# Patient Record
Sex: Female | Born: 1960 | Race: White | Hispanic: No | Marital: Single | Smoking: Former smoker
Health system: Southern US, Community
[De-identification: ages and names within clinical notes are randomized; demographics above are authoritative.]

## PROBLEM LIST (undated history)

## (undated) DIAGNOSIS — R569 Unspecified convulsions: Secondary | ICD-10-CM

## (undated) DIAGNOSIS — Z87891 Personal history of nicotine dependence: Secondary | ICD-10-CM

## (undated) DIAGNOSIS — I69319 Unspecified symptoms and signs involving cognitive functions following cerebral infarction: Secondary | ICD-10-CM

## (undated) DIAGNOSIS — I639 Cerebral infarction, unspecified: Secondary | ICD-10-CM

## (undated) DIAGNOSIS — N920 Excessive and frequent menstruation with regular cycle: Secondary | ICD-10-CM

## (undated) DIAGNOSIS — E785 Hyperlipidemia, unspecified: Secondary | ICD-10-CM

## (undated) DIAGNOSIS — F419 Anxiety disorder, unspecified: Secondary | ICD-10-CM

## (undated) DIAGNOSIS — N201 Calculus of ureter: Secondary | ICD-10-CM

## (undated) DIAGNOSIS — N1330 Unspecified hydronephrosis: Secondary | ICD-10-CM

## (undated) DIAGNOSIS — K219 Gastro-esophageal reflux disease without esophagitis: Secondary | ICD-10-CM

## (undated) DIAGNOSIS — D126 Benign neoplasm of colon, unspecified: Secondary | ICD-10-CM

## (undated) DIAGNOSIS — F32A Depression, unspecified: Secondary | ICD-10-CM

## (undated) DIAGNOSIS — Z7902 Long term (current) use of antithrombotics/antiplatelets: Secondary | ICD-10-CM

## (undated) DIAGNOSIS — N390 Urinary tract infection, site not specified: Secondary | ICD-10-CM

## (undated) DIAGNOSIS — K5909 Other constipation: Secondary | ICD-10-CM

## (undated) DIAGNOSIS — I739 Peripheral vascular disease, unspecified: Secondary | ICD-10-CM

## (undated) DIAGNOSIS — I700 Atherosclerosis of aorta: Secondary | ICD-10-CM

## (undated) HISTORY — PX: TUBAL LIGATION: SHX77

---

## 2006-12-06 DIAGNOSIS — I639 Cerebral infarction, unspecified: Secondary | ICD-10-CM

## 2006-12-06 HISTORY — DX: Cerebral infarction, unspecified: I63.9

## 2007-04-15 ENCOUNTER — Ambulatory Visit: Payer: Self-pay | Admitting: Cardiology

## 2007-04-20 ENCOUNTER — Inpatient Hospital Stay (HOSPITAL_COMMUNITY): Admission: AD | Admit: 2007-04-20 | Discharge: 2007-05-05 | Payer: Self-pay | Admitting: Neurology

## 2007-04-20 ENCOUNTER — Ambulatory Visit: Payer: Self-pay | Admitting: Pulmonary Disease

## 2007-04-24 ENCOUNTER — Ambulatory Visit: Payer: Self-pay | Admitting: Physical Medicine & Rehabilitation

## 2007-05-17 ENCOUNTER — Ambulatory Visit: Payer: Self-pay | Admitting: Internal Medicine

## 2007-09-24 ENCOUNTER — Emergency Department (HOSPITAL_COMMUNITY): Admission: EM | Admit: 2007-09-24 | Discharge: 2007-09-24 | Payer: Self-pay | Admitting: Emergency Medicine

## 2007-10-12 ENCOUNTER — Ambulatory Visit (HOSPITAL_COMMUNITY): Admission: RE | Admit: 2007-10-12 | Discharge: 2007-10-12 | Payer: Self-pay | Admitting: Internal Medicine

## 2010-03-21 ENCOUNTER — Emergency Department (HOSPITAL_COMMUNITY): Admission: EM | Admit: 2010-03-21 | Discharge: 2010-03-21 | Payer: Self-pay | Admitting: Emergency Medicine

## 2011-02-01 ENCOUNTER — Other Ambulatory Visit (HOSPITAL_COMMUNITY): Payer: Self-pay | Admitting: Family Medicine

## 2011-02-01 DIAGNOSIS — M79604 Pain in right leg: Secondary | ICD-10-CM

## 2011-02-01 DIAGNOSIS — M545 Low back pain: Secondary | ICD-10-CM

## 2011-02-08 ENCOUNTER — Ambulatory Visit (HOSPITAL_COMMUNITY)
Admission: RE | Admit: 2011-02-08 | Discharge: 2011-02-08 | Disposition: A | Discharge status: Home / Self Care | Payer: Medicaid Other | Source: Ambulatory Visit | Attending: Family Medicine | Admitting: Family Medicine

## 2011-02-08 DIAGNOSIS — M545 Low back pain, unspecified: Secondary | ICD-10-CM | POA: Insufficient documentation

## 2011-02-08 DIAGNOSIS — M79609 Pain in unspecified limb: Secondary | ICD-10-CM | POA: Insufficient documentation

## 2011-02-08 DIAGNOSIS — M79604 Pain in right leg: Secondary | ICD-10-CM

## 2011-02-08 DIAGNOSIS — M5137 Other intervertebral disc degeneration, lumbosacral region: Secondary | ICD-10-CM | POA: Insufficient documentation

## 2011-02-08 DIAGNOSIS — M51379 Other intervertebral disc degeneration, lumbosacral region without mention of lumbar back pain or lower extremity pain: Secondary | ICD-10-CM | POA: Insufficient documentation

## 2011-02-23 LAB — CK TOTAL AND CKMB (NOT AT ARMC): Total CK: 17 U/L (ref 7–177)

## 2011-02-23 LAB — COMPREHENSIVE METABOLIC PANEL
ALT: 31 U/L (ref 0–35)
AST: 23 U/L (ref 0–37)
Albumin: 2.9 g/dL — ABNORMAL LOW (ref 3.5–5.2)
Alkaline Phosphatase: 82 U/L (ref 39–117)
BUN: 9 mg/dL (ref 6–23)
Creatinine, Ser: 0.54 mg/dL (ref 0.4–1.2)
GFR calc Af Amer: >60 mL/min (ref >60)
GFR calc non Af Amer: >60 mL/min (ref >60)
Glucose, Bld: 92 mg/dL (ref 70–99)
Potassium: 3.6 mEq/L (ref 3.5–5.1)
Sodium: 142 mEq/L (ref 135–145)
Total Protein: 6.6 g/dL (ref 6.0–8.3)

## 2011-02-23 LAB — TROPONIN I: Troponin I: 0.01 ng/mL (ref 0.00–0.06)

## 2011-02-23 LAB — CBC: RDW: 24.1 % — ABNORMAL HIGH (ref 11.5–15.5)

## 2011-04-20 NOTE — Op Note (Signed)
Sharon Stevenson, Sharon Stevenson                ACCOUNT NO.:  [id number]   MEDICAL RECORD NO.:  [id number]          PATIENT TYPE:  INP   LOCATION:  5156                         FACILITY:  [facility]   PHYSICIAN:  Petra Kuba, M.D.    DATE OF BIRTH:  [birth year]   DATE OF PROCEDURE:  04/28/2007  DATE OF DISCHARGE:                               OPERATIVE REPORT   PROCEDURE:  Percutaneous endoscopic gastrostomy tube placement.   INDICATIONS:  Significant CVA.  Consent was signed after risks,  benefits, methods, and options were thoroughly discussed with power of  attorney, her common law husband.   MEDICINES USED:  Fentanyl 50, Versed 4.   DESCRIPTION OF PROCEDURE:  The video endoscope was inserted by direct  vision.  She did have a small hiatal hernia.  The scope was passed into  the stomach and advanced through a normal antrum, normal pylorus, into a  normal duodenal bulb and around the C-loop to a normal second portion of  the duodenum.  The scope was withdrawn back to the bulb and a good look  there ruled out ulcers in that location.  The scope was withdrawn back  to the stomach which was evaluated on straight and retroflex  visualization and no abnormalities were seen.  The scope was then slowly  withdrawn back to 20 cm, again confirming the normal esophagus.  Based  on her being on Lovenox and Plavix, she did have a bit of bleeding with  just some minimal trauma, but no significant bleeding.  The scope was  then advanced to the greater curve and the light reflex was seen in the  mid epigastric area. One-to-one finger palpation was confirmed  endoscopically. All the abdomen was dressed and draped.  The snare was  advanced through the scope. 1% lidocaine was used to raise a wheel by my  PA, Algis Downs.  A small incision was made with the blade.  The  introducer needle was advanced into the stomach on the first attempt and  the blue [place] pull cord was advanced through the introducer needle,  grabbed with the snare, and the cord, snare, and scope were withdrawn  through the patient's mouth.  The 24-French [employer]  style pull peg was attached to the cord and withdrawn through the  abdominal incision in the customary fashion. While the bumper was being  placed to tighten the PEG, the scope was reinserted. The proper tension  on the bumper was confirmed endoscopically.  The abdomen was dressed.  The scope was slowly withdrawn.  There were no signs of active bleeding  at the end of the procedure. The patient tolerated the procedure well.  There was no obvious immediate complication.   ENDOSCOPIC DIAGNOSES:  1. Small hiatal hernia.  2. Otherwise, normal EGD.   THERAPY:  24-French [employer] pull PEG placed in the customary  fashion.   PLAN:  Customary post PEG orders.  Please see chart. Consider whether  they need both Plavix or Lovenox. Will follow with you until tolerating  tube feeds.  ______________________________  Petra Kuba, M.D.     MEM/MEDQ  D:  04/28/2007  T:  04/28/2007  Job:  [id number]   cc:   Genene Churn. Love, M.D.

## 2011-04-20 NOTE — Procedures (Signed)
EEG ID# [id number].   CLINICAL HISTORY:  She is a 50 year old lady  with a large brain stem  and thalamic stroke.  She was unresponsive.  EEG done to evaluate for  seizure activity.   MEDICATIONS:  Xanax, Vicodin, Aleve, fentanyl, Versed, and Dilantin.   TECHNIQUE:  This is a portable 17-channel EEG recorded with the patient  described as unresponsive, using standard 10/20 electrode placement and  17-lead machine.     Background rhythm consists of 67 Hz theta which is rhythmic, symmetric  and of average amplitude.  No paroxysmal epileptiform activity as spikes  are noted.  Isolated intermittent left temporal sharp waves are seen  with phase.  PHAC reversal over the P3 electrode in the mid temporal  region.  Length of this EEG is 21.7 minutes.  Technical component is  excellent.  Definitive sleep stages are not seen.  Hyperventilation is  not performed.  Photic stimulation unremarkable.  EKG tracing reveals  regular sinus rhythm.   IMPRESSION:  This EEG is abnormal due to presence of mild bihemispheric  slowing indicative of bihemispheric dysfunction of mild degree which is  a nonspecific finding seen in a variety of conditions.  There is also a  left temporal cortical irritability but no definite seizure activity  seen.           ______________________________  Sunny Schlein. Pearlean Brownie, MD     [id number]  D:  05/04/2007 18:09:22  T:  05/05/2007 03:34:28  Job #:  [id number]

## 2011-04-20 NOTE — Discharge Summary (Signed)
[name], Stevenson                ACCOUNT NO.:  [id number]   MEDICAL RECORD NO.:  [id number]          Stevenson TYPE:  INP   LOCATION:  5156                         FACILITY:  [facility]   PHYSICIAN:  Pramod P. Pearlean Brownie, MD    DATE OF BIRTH:  February 04, 1961   DATE OF ADMISSION:  04/20/2007  DATE OF DISCHARGE:  05/05/2007                               DISCHARGE SUMMARY   DICTATED BY:  Annie Main, N.P.   DISCHARGE DIAGNOSES:  1. 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  2. Severe right vertebral stenosis, not a candidate for stent      secondary to severe stroke.  3. Seizures, new onset during hospitalization.  4. Urinary tract infection with completed course of Cipro.  5. Dyslipidemia.  6. Obesity.  7. Dysphagia secondary to stroke status post PEG (percutaneous      endoscopic gastrostomy).  8. Chronic low back pain.  9. Anxiety.  10.Cholecystectomy.  11.Cigarette smoker.   DISCHARGE MEDICATIONS:  1. Lantus 5 units subcu q.12h.  2. Protonix 40 mg a day.  3. Potassium 20 mEq b.i.d.  4. Plavix 75 mg a day.  5. Zocor 20 mg a day.  6. Labetalol 200 mg b.i.d.  7. Neomycin to PEG site once a day starting Apr 29, 2007, ending May 05, 2007.  8. Provigil 100 mg 6 a.m. and 10 a.m.  9. Dilantin 300 mg q.12h.  10.Free water 140 mL 5x a day.  11.Jevity 340 mL 5x a day.   STUDIES PERFORMED:  1. MRI of Sharon brain shows extensive posterior territory infarct      involving Sharon inferior right cerebellum and vermis, Sharon central mid      brain extending through Sharon cerebellar peduncles medially to      involve Sharon medial aspect of Sharon thalami bilaterally.  No evidence      of acute hemorrhage or mass.  White matter disease slightly greater      than expected for age including adjacent to Sharon frontal horn of Sharon      right lateral ventricle and scattered subcortical areas      bilaterally.  2. MRA of Sharon neck shows high-grade stenosis  of Sharon proximal right      vertebral artery which is Sharon dominant vessel.  Sharon left vertebral      artery is hypoplastic but becomes atretic distally.  There may be a      high-grade stenosis proximally as well.  There is at least a 50-60%      stenosis proximal left common carotid artery.  No significant      carotid bifurcation disease.  3. MRA of Sharon head shows mild narrowing right P1 segment.  Right      vertebral artery appears to be Sharon sole contributor to Sharon basilar.  4. Chest x-ray left lower lung atelectasis.  Followup with new right      lower lobe atelectasis.  Improving right basilar opacity.  Interval      improvement basilar aeration.  No change in lung volumes and      bibasilar atelectasis.  5. Multiple abdominal x-rays confirming tube placement.  6. CT of Sharon head Apr 26, 2007, shows acute infarct in Sharon left      thalamus, mid brain and right inferior cerebellum, no hemorrhage,      question early acute infarct in occipital lobes bilaterally.  7. Followup CT on May 02, 2007, shows edema from left thalamic and mid      brain is difficult to appreciate on exam today.  Right cerebellar      infarction noted.  8. Last chest x-ray done May 04, 2007, shows patchy bibasilar      atelectasis.  9. EKG not present in chart.  10.Carotid Doppler done at [facility] with no acute abnormalities.  11.Two-D echocardiogram performed at [facility] shows no acute      abnormalities.   LABORATORY DATA:  CBC with white blood cells on admission 23.5 down to  14.1.  RDW 14.2 on admission, 14.7 at discharge.  Platelets 477 at  admission, 463 at discharge.  Hemoglobin has ranged 11.3 to 13.3.  Differential was 10.6 neutrophils absolute and 0.8 monocytes absolute,  otherwise normal.  Coagulation studies on admission were normal.  Chemistry with glucoses elevated 97 to 168.  Liver function tests with  AST 44, ALT 81, alkaline phosphatase 114, total bilirubin 0.7, albumin  of 2.7, total protein  7.4.  Hemoglobin A1c 5.9, homocysteine 4.8,  cholesterol 194, triglycerides 127, HDL 30, LDL 139.  Urinalysis on May  21 shows moderate leukocyte esterase, 11-20 white blood cells and too  numerous to count red blood cells, rare bacteria.  Blood culture no  growth x3, one blood culture with coag negative staph, likely  insignificant.  Urine culture no growth.   HISTORY OF PRESENT ILLNESS:  Sharon Stevenson is a 50 year old Caucasian  female transferred from [facility] to [facility] on an  emergency basis by ambulance for evaluation of sudden onset of bilateral  third nerve palsy and unresponsiveness.  Sharon Stevenson was admitted to  [facility] on Apr 15, 2007, after a 2 day history of recurrent  nausea and vomiting, headache and balance problems.  She was found to  have evidence of a right cerebellar ischemic stroke on CT scan Sharon day  of admission that was 3 x 2.5 cm with depressed attenuation compatible  with edema.  There was noted to be minimal mass effect on Sharon inferior  aspect of Sharon fourth ventricle.  She underwent MRI on May 12 which  apparently confirmed Sharon right cerebellar stroke.  In Sharon hospital there  she was consulted by Dr. Benson Setting, neurologist, on May 13 with giving Sharon  history Sharon Stevenson presented with acute weakness of left leg associated  with a fall.  At Sharon time he saw her she was alert and oriented x3.  There was no aphasia, no dysarthria.  She did have double vision.  Sharon  extraocular movements were full.  She moved all extremities and reflexes  were symmetric.  It was thought she had an acute right cerebellar  infarct only.  She was in her usual state of health and apparently early  Sharon morning of May 15 developed bilateral third nerve palsy and had to  be intubated.  Dr. Sandria Manly was called at 3 p.m. in Sharon afternoon and she  was transferred to [facility].  Decadron IV was given prior to transfer.  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  She was  admitted to Sharon  ICU for further evaluation.   HOSPITAL COURSE:  Sharon Stevenson had bilateral thalamic and upper mid brain  infarcts that were new since initial hospitalization at [facility].  She  was intubated and critical care medicine was consulted.  On her MRA she  was found to have severe right vertebral artery stenosis.  She was  placed on IV heparin initially then changed to aspirin and Plavix.  She  was evaluated for rehab and felt to be appropriate.  She was moved to  Sharon floor.  Throughout her hospitalization Sharon Stevenson had waxing and  waning lethargy.  She was diagnosed with a urinary tract infection and  treated with a 7 day course of Macrobid.  Speech therapy followed Sharon  Stevenson and she was unable to swallow and PEG was required.  PEG was  inserted by Dr. Vida Rigger on Apr 28, 2007, without difficulty.  Due to  Sharon Stevenson's lethargy Sharon Stevenson was started on Provigil to hopefully  increase alertness.  Her lethargy continued.  EEG performed showed  seizure activity though very minimal.  She was started on Dilantin.  Dose was adjusted for her weight and albumin.  Sharon Stevenson continued  with Sharon lethargy and difficulty working with therapies secondary to  lethargy.  She was no longer a rehab candidate as Sharon Stevenson was unable  to care for her at home given her deficits.  Skilled nursing facility  placement search was started and bed found.  Sharon Stevenson for transfer to  skilled nursing facility once family approves location.   Of note, Sharon Stevenson lives with a man she refers to as her husband who  is not her legal husband but her common-law husband.  They have children  and Sharon oldest child is her official Management consultant.   CONDITION AT DISCHARGE:  Sharon Stevenson is lethargic, remains sleepy with  eyes closed but does follow some commands well on Sharon right.  She speaks  a few words.  She moves her right upper extremity purposefully.  She has  complete ophthalmoplegia.  She has  exotropia of Sharon left eye.  Her right  pupil is 4 mm and fixed, left pupil is 8 mm and fixed.  She withdraws  her left upper extremity more than her right upper extremity.  She has  bilateral leg weakness.   DISCHARGE PLAN:  1. Discharge to skilled nursing facility for continuation of care.  2. PEG for swallowing.  3. Aspirin and Plavix for secondary stroke prevention.  4. Continued Dilantin for seizure prophylaxis.  5. Not a candidate for angioplasty or stent of vertebral artery      secondary to severity of stroke.  6. Followup by primary care physician.  7. Followup Dr. Delia Heady 2-3 months.      Annie Main, N.P.    ______________________________  Sunny Schlein. Pearlean Brownie, MD    SB/MEDQ  D:  05/05/2007  T:  05/05/2007  Job:  [id number]   cc:   Pramod P. Pearlean Brownie, MD  Lia Hopping

## 2011-04-20 NOTE — H&P (Signed)
Sharon Stevenson, Sharon Stevenson                ACCOUNT NO.:  [id number]   MEDICAL RECORD NO.:  [id number]          PATIENT TYPE:  INP   LOCATION:  3114                         FACILITY:  [facility]   PHYSICIAN:  Genene Churn. Love, M.D.    DATE OF BIRTH:  07-Nov-1961   DATE OF ADMISSION:  04/20/2007  DATE OF DISCHARGE:                              HISTORY & PHYSICAL   This is the first [facility] admission for this 50 year old  white female who transferred from [facility] on an emergency  basis by ambulance for evaluation of sudden onset of bilateral 3rd nerve  palsy and unresponsiveness.   HISTORY OF PRESENT ILLNESS:  Ms. Rother was admitted to [facility]  on Apr 15, 2007, after a 2-day history of recurrent nausea and vomiting,  headache, and balance problems.  She was found to have evidence of a  right cerebellar ischemic stroke on CT scan the day of admission  approximately 3 x 2.5-cm with decreased attenuation compatible with  edema.  There was noted to be minimal mass effect on the inferior aspect  of the fourth ventricle.  She underwent an MRI study on Apr 17, 2007,  which I do not have report of, but apparently also confirmed the right  cerebellar stroke.  In the hospital, she was seen in consultation by Dr.  Benson Setting, neurologist on Apr 18, 2007.  With getting a history, the  patient presented with acute weakness of the left leg associated with a  fall.  At the time he saw her, she was alert, oriented x3.  There was no  aphasia.  No dysarthria.  She did have double vision.  The extraocular  movements, however, were thought to be full.  She moved all extremities.  Reflexes were symmetric.  It was thought that she had acute right  cerebellar infarction.  She was in her usual state of health and  apparently early this morning developed bilateral 3rd nerve palsies  extremis and had to be intubated.  I was called about 3 this afternoon  and she was transferred to [facility].   Steroids were given,  Decadron IV prior to transfer.  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   PAST MEDICAL HISTORY:  Significant for:  1. Chronic low back pain.  2. Anxiety.  3. Obesity.  4. Cholecystectomy.   SHE HAS NO KNOWN ALLERGIES.   She does not drink alcohol.  She does smoke cigarettes.   Her medications have been:  1. Xanax 1 mg two times per day.  2. Vicodin 5/500 two times per day.  3. Aleve three times per day.   She denied alcohol abuse.   She has had gallbladder surgery in the past.   FAMILY HISTORY:  Positive for diabetes mellitus.  Her mother has had a  previous history of stroke.  And, her brother has had a myocardial  infarction.   PHYSICAL EXAMINATION:  GENERAL:  At the time of transfer to [facility] revealed a well-developed, obese, white female intubated.  VITAL SIGNS:  Blood pressure right arm 170/80, left arm  160/80, heart  rate was 87.  Her temperature was 100.6 rectally.  LUNGS:  She did breathe over the vent.  HEENT:  Her eyes were closed.  NEUROLOGIC:  She did move all extremities but did not follow commands.  There was no posturing.  Her cranial nerve examination revealed the  pupils were 7-mm without reaction.  She did not have doll's eyes.  She  did have corneals.  With ice water, she did move her left eye to the  left.  There was no other movement.  She did have gags.  She was  vomiting blood.  Her motor examination revealed she moved all  extremities.  I could not be certain that there was less movement in one  than the other.  Her deep tendon reflexes were 1 to 2+, and plantar  responses were downgoing.  The tympanic membranes were clear.  She was  vomiting blood.  ABDOMEN:  She had bowel sounds.  HEART:  Revealed no murmur.  EXTREMITIES:  There was no edema.   LABORATORY DATA:  From this day from [facility] revealed a pH of 7.45,  pCO2 38, pO2 79.  Sodium 136, potassium 3.9, chloride 104, CO2 content  26, BUN 7,  creatinine 0.70, glucose 120, calcium 8.9.  White blood cell  count 16,500, hemoglobin 13.8, hematocrit 41.8, platelets 547 K.   IMPRESSION:  1. Suspect bilateral 3rd nerve palsy.  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  2. Obesity.  3. Chronic obstructive pulmonary disease.   PLAN:  At this time is to consult critical care, paralyze the patient,  obtain an MRI, and place the patient on IV Decadron.           ______________________________  Genene Churn. Sandria Manly, M.D.     JML/MEDQ  D:  04/20/2007  T:  04/20/2007  Job:  [id number]   cc:   Lia Hopping

## 2011-04-20 NOTE — H&P (Signed)
[name], [name]                ACCOUNT NO.:  [id number]   MEDICAL RECORD NO.:  [id number]          PATIENT TYPE:  INP   LOCATION:  3114                         FACILITY:  [facility]   PHYSICIAN:  Genene Churn. Love, M.D.    DATE OF BIRTH:  1961-10-26   DATE OF ADMISSION:  04/20/2007  DATE OF DISCHARGE:                              HISTORY & PHYSICAL   ADDENDUM:  I spent a total of one and half hours of critical care time with [name], paralyzed, sent to MRI.  Having her sent back to MRI, discussing  her with critical care and talking with the family regarding her  findings of the MRIs and use of anticoagulation therapy and discussing  with consulting neurosurgeons who came in to see in the critical care  unit.           ______________________________  Genene Churn. Sandria Manly, M.D.     JML/MEDQ  D:  04/21/2007  T:  04/21/2007  Job:  [id number]

## 2011-04-20 NOTE — Procedures (Signed)
EEG NUMBER:  [id number].   This is a 50 year old female patient of Dr. Delia Heady.  The patient's  EEG took place on 05/02/2007 as a routine EEG study.  The patient was  described as unresponsive, however.  No activating procedures such as  hyperventilation or photostimulation were noted.   The patient gives the following history:  This is a 50 year old female  admitted with a right cerebellar stroke who developed a decreased level  of consciousness and dilated pupils requiring intubation.  The patient  is extubated but remains unresponsive but breathing on her own.   MEDICATIONS:  It is not clear how soon before the EEG was given some of  these medications might have been taken; include Zocor, NovoLog insulin,  Plavix, Lovenox, potassium chloride, Ventolin, Tylenol, Provigil, Cipro,  Normodyne, Xanax, and Vicodin.   A posterior dominant background rhythm for this patient shows a 6.5-Hz  rhythm.  The posterior dominance is interestingly preserved.  Also, the  frequency of EEG activity in different leads appears equal.  During this  recording, the patient developed epileptiform discharges in the left  temporal region over T3.  Phase reversal is noted followed by a  decrement in brain wave activity.  The EKG showed rhythms between 74 and  58 beats per minute.  The described 8-Hz rhythm alternates with  generalized slowing down to the theta range frequencies.  Also, no delta  range frequency was documented.   CONCLUSION:  This is an abnormal EEG showing epileptiform activity over  the left temporal region indicated by phase reversal.  Interestingly,  there is no associated tachycardia.  The correlation of phase reversal  activity in the left temporal lobe clinically is recommended.      Melvyn Novas, M.D.  Electronically Signed     [id number]  D:  05/02/2007 18:06:08  T:  05/03/2007 06:56:06  Job #:  [id number]   cc:   Pramod P. Pearlean Brownie, MD  Fax: [phone]

## 2011-04-20 NOTE — H&P (Signed)
NAMEKHANH, Sharon Stevenson                ACCOUNT NO.:  [id number]   MEDICAL RECORD NO.:  [id number]          PATIENT TYPE:  INP   LOCATION:  3114                         FACILITY:  [facility]   PHYSICIAN:  Sharon Stevenson, M.D.    DATE OF BIRTH:  [birth year]   DATE OF ADMISSION:  04/20/2007  DATE OF DISCHARGE:                              HISTORY & PHYSICAL   ADDENDUM:  Further information is now available from her husband who has  arrived.  Sharon Stevenson has a history of symptoms beginning 1 week ago on a  Wednesday evening, Apr 12, 2007, when she began taking Aleve three twice  per day for pain for her menstrual cycle and also Vicodin 500 twice per  day for back pain and menstrual pain.  Thursday morning, Apr 13, 2007,  she awoke with staggering like a drunk with nausea and vomiting and  headache but refused to go to the hospital.  These symptoms continued on  Friday and Saturday.  Her husband took her to [facility] Apr 15, 2007.  At that time, she had headache, nausea and vomiting, staggering  gait and complaints of double vision.  He states in the hospital she had  improved and on Apr 19, 2007 was combing her hair, ate a good meal  and then at 4:30 this morning went into distress.  He was told that she  had a seizure and was loaded with Dilantin and was intubated.  She has a  history he states of chronic history of cigarette use.  She has also had  intermittent hypertension in the past he states but she has not been on  medications for that.  She also has a past history of kidney stones,  heart murmur, chronic low back pain and anxiety.   Medications prior to admission include Xanax 1 mg b.i.d. and Vicodin  5/500 b.i.d.   Her family history reveals that her mother died at 104 from a stroke and  myocardial infarction.  Father died at 31 from alcoholic cirrhosis.  She  has had 2 brothers and 13 sisters, 3 are half siblings and apparently in  good health.  She has 2 children by her current  husband-a son 10 and a  daughter 83, living and well.   MEDICAL REVIEW OF SYSTEMS:  Her husband reveals that she has had  recurrent nausea and vomiting over the last few days.           ______________________________  Sharon Stevenson, M.D.     JML/MEDQ  D:  04/20/2007  T:  04/20/2007  Job:  [id number]

## 2011-09-15 LAB — BASIC METABOLIC PANEL
BUN: 8
Chloride: 102
Creatinine, Ser: 0.41
Glucose, Bld: 100 — ABNORMAL HIGH

## 2011-09-15 LAB — URINALYSIS, ROUTINE W REFLEX MICROSCOPIC
Glucose, UA: NEGATIVE
Ketones, ur: NEGATIVE
Nitrite: NEGATIVE
Protein, ur: NEGATIVE
Urobilinogen, UA: 0.2

## 2011-09-15 LAB — CULTURE, BLOOD (ROUTINE X 2)
Culture: NO GROWTH
Report Status: 10242008

## 2011-09-15 LAB — CBC
MCV: 85.6
Platelets: 562 — ABNORMAL HIGH
RDW: 14.3 — ABNORMAL HIGH
WBC: 14.6 — ABNORMAL HIGH

## 2011-09-15 LAB — DIFFERENTIAL
Basophils Absolute: 0.1
Basophils Relative: 0
Eosinophils Absolute: 0.2
Eosinophils Relative: 2
Lymphs Abs: 3
Neutrophils Relative %: 73

## 2011-09-15 LAB — URINE MICROSCOPIC-ADD ON

## 2011-12-20 ENCOUNTER — Other Ambulatory Visit (HOSPITAL_COMMUNITY): Payer: Self-pay | Admitting: Family Medicine

## 2011-12-20 DIAGNOSIS — R52 Pain, unspecified: Secondary | ICD-10-CM

## 2011-12-24 ENCOUNTER — Ambulatory Visit (HOSPITAL_COMMUNITY): Payer: Medicaid Other

## 2012-01-03 ENCOUNTER — Ambulatory Visit (HOSPITAL_COMMUNITY)
Admission: RE | Admit: 2012-01-03 | Discharge: 2012-01-03 | Disposition: A | Discharge status: Home / Self Care | Payer: Medicaid Other | Source: Ambulatory Visit | Attending: Family Medicine | Admitting: Family Medicine

## 2012-01-03 DIAGNOSIS — M545 Low back pain, unspecified: Secondary | ICD-10-CM | POA: Insufficient documentation

## 2012-01-03 DIAGNOSIS — M5126 Other intervertebral disc displacement, lumbar region: Secondary | ICD-10-CM | POA: Insufficient documentation

## 2012-01-03 DIAGNOSIS — M5380 Other specified dorsopathies, site unspecified: Secondary | ICD-10-CM | POA: Insufficient documentation

## 2012-01-03 DIAGNOSIS — M5137 Other intervertebral disc degeneration, lumbosacral region: Secondary | ICD-10-CM | POA: Insufficient documentation

## 2012-01-03 DIAGNOSIS — R52 Pain, unspecified: Secondary | ICD-10-CM

## 2012-01-03 DIAGNOSIS — M51379 Other intervertebral disc degeneration, lumbosacral region without mention of lumbar back pain or lower extremity pain: Secondary | ICD-10-CM | POA: Insufficient documentation

## 2012-01-05 ENCOUNTER — Emergency Department (HOSPITAL_COMMUNITY)
Admission: EM | Admit: 2012-01-05 | Discharge: 2012-01-05 | Disposition: A | Discharge status: Home / Self Care | Payer: Medicaid Other | Attending: Emergency Medicine | Admitting: Emergency Medicine

## 2012-01-05 ENCOUNTER — Emergency Department (HOSPITAL_COMMUNITY): Payer: Medicaid Other

## 2012-01-05 ENCOUNTER — Encounter (HOSPITAL_COMMUNITY): Payer: Self-pay | Admitting: *Deleted

## 2012-01-05 DIAGNOSIS — Z8673 Personal history of transient ischemic attack (TIA), and cerebral infarction without residual deficits: Secondary | ICD-10-CM | POA: Insufficient documentation

## 2012-01-05 DIAGNOSIS — R569 Unspecified convulsions: Secondary | ICD-10-CM | POA: Insufficient documentation

## 2012-01-05 DIAGNOSIS — F0390 Unspecified dementia without behavioral disturbance: Secondary | ICD-10-CM | POA: Insufficient documentation

## 2012-01-05 DIAGNOSIS — M545 Low back pain, unspecified: Secondary | ICD-10-CM

## 2012-01-05 HISTORY — DX: Cerebral infarction, unspecified: I63.9

## 2012-01-05 MED ORDER — HYDROCODONE-ACETAMINOPHEN 5-325 MG PO TABS
2.0000 | ORAL_TABLET | Freq: Once | ORAL | Status: AC
  Administered 2012-01-05: 2 via ORAL
  Filled 2012-01-05: qty 2

## 2012-01-05 MED ORDER — DOCUSATE SODIUM 100 MG PO CAPS: 100.0000 mg | ORAL_CAPSULE | Freq: Two times a day (BID) | ORAL | Status: AC | PRN

## 2012-01-05 MED ORDER — OXYCODONE-ACETAMINOPHEN 5-325 MG PO TABS: ORAL_TABLET | ORAL | Status: DC

## 2012-01-05 MED ORDER — ALPRAZOLAM 1 MG PO TABS: 1.0000 mg | ORAL_TABLET | Freq: Three times a day (TID) | ORAL | Status: AC | PRN

## 2012-01-05 NOTE — ED Notes (Signed)
Pt to xray

## 2012-01-05 NOTE — ED Provider Notes (Signed)
History     CSN: [id number]  Arrival date & time 01/05/12  1107   First MD Initiated Contact with Patient 01/05/12 1304      Chief Complaint  Patient presents with  . Back Pain    (Consider location/radiation/quality/duration/timing/severity/associated sxs/prior treatment) HPI Comments: Patient has significant difficulty walking and cannot walk alone. She is mostly wheelchair-bound do to a stroke. Patient also has a history of seizures, which I attribute to the history of stroke and she is on Dilantin. Patient lives at home with her spouse. Over the last 4 days, the patient does have an increase in lower back pain without any history of any traumatic injury. It is in the middle of her low back just above the cleft of her buttocks. Patient's spouse has been giving her over-the-counter medications including some prescription Flexeril that was his medication without any significant relief. Pain has been constant but waxing and waning in severity. Patient does have a history of intermittent constipation, however had a very good bowel movement yesterday as well as today as before. The patient denies any abdominal pain, nausea or vomiting. Neither has noted any fevers or chills. Patient and family member deny any new focal neurologic weakness or abnormalities from baseline.  Patient is a 51 y.o. female presenting with back pain. The history is provided by the patient and the spouse.  Back Pain     Past Medical History  Diagnosis Date  . Stroke     Past Surgical History  Procedure Date  . Cesarean section   . Tubal ligation     History reviewed. No pertinent family history.  History  Substance Use Topics  . Smoking status: Never Smoker   . Smokeless tobacco: Not on file  . Alcohol Use: No    OB History    Grav Para Term Preterm Abortions TAB SAB Ect Mult Living                  Review of Systems  Unable to perform ROS: Dementia  Musculoskeletal: Positive for back pain.     Allergies  Review of patient's allergies indicates no known allergies.  Home Medications   Current Outpatient Rx  Name Route Sig Dispense Refill  . PHENYTOIN 50 MG PO CHEW Oral Chew 150 mg by mouth 3 (three) times daily.    Marland Kitchen ALPRAZOLAM 1 MG PO TABS Oral Take 1 tablet (1 mg total) by mouth 3 (three) times daily as needed for anxiety. 30 tablet 0  . DOCUSATE SODIUM 100 MG PO CAPS Oral Take 1 capsule (100 mg total) by mouth 2 (two) times daily as needed for constipation. 60 capsule 0  . OXYCODONE-ACETAMINOPHEN 5-325 MG PO TABS  1-2 tablets po q 6 hours prn moderate to severe pain 30 tablet 0    BP 120/73  Pulse 65  Temp(Src) 98.3 F (36.8 C) (Oral)  Resp 16  Ht 6\' 1"  (1.854 m)  Wt 165 lb (74.844 kg)  BMI 21.77 kg/m2  SpO2 97%  LMP 12/20/2011  Physical Exam  Nursing note and vitals reviewed. Constitutional: She appears well-developed.  HENT:  Head: Normocephalic.  Neck: Neck supple.  Cardiovascular: Normal rate.   Pulmonary/Chest: No respiratory distress. She has no wheezes.  Abdominal: Soft. She exhibits no distension. There is no tenderness.  Musculoskeletal:       Back:  Neurological: She is alert. She has normal strength. She exhibits abnormal muscle tone.       5/ 5 plantar and dorsi flexion  of both lower extremities.  Skin: Skin is warm and dry. No rash noted.    ED Course  Procedures (including critical care time)  Labs Reviewed - No data to display Dg Lumbar Spine Complete  01/05/2012  *RADIOLOGY REPORT*  Clinical Data: Chronic back pain.  LUMBAR SPINE - COMPLETE 4+ VIEW  Comparison: MRI 01/03/2012  Findings: Leftward scoliosis.  Degenerative disc disease changes at L4-5.  Normal alignment.  No fracture.  Mild degenerative facet disease at L4-5 and L5-S1.  IMPRESSION: Spondylosis and scoliosis.  No acute findings.  Original Report Authenticated By: Cyndie Chime, M.D.     1. Low back pain       MDM   Patient's pain is quite reproducible, pinpoint  at the top of her sacrum or possibly an L5 region. Plan is to obtain plain films of the lumbar spine. I low suspicion for any acute abnormality. Given the patient's primarily is laying in bed and sitting in a wheelchair, patient likely has some localized musculoskeletal discomfort that is resistant to over-the-counter medications. My plan is to give her a prescription for narcotic pain medication to take for the next few days. She is to followup with her primary care physician as the patient and family member are made aware that pain medicine likely will not actually he'll any acute pathology. However my impression is that this may be acute on chronic condition. I also stressed the importance of taking stool softeners given her history of constipation. Patient's family member reports that he is trying to establish her with a primary care physician in the [place], [place] area.      3:50 PM Family member reports the patient actually did sleep here for about an hour and that she did feel much improved. Plain films which I also reviewed myself, per the radiologist showed no acute abnormalities. Patient's family member asked if I would be willing to give her some prescriptions for pain medication and anxiety medications which she has been on in the past. However because he is having difficulty between finance is as well as establishing with a regular primary care physician, they have not been able to obtain these medications. He reports the earliest appointment that he has been able to locate with a family physician is in about 3 weeks. My plan is to give her a prescription that may last about one week for Percocet and Xanax. He reports that she used to take Percocet 10 mg twice a day as well as Xanax 1 mg twice a day. I do not feel comfortable giving such a high dose when she has not been on such medications in a while, however I will give her about one-week supply.  Gavin Pound. Waylon Koffler, MD 01/05/12 [phone]

## 2012-01-05 NOTE — ED Notes (Signed)
Pt brought in by family for c/o lower back pain radiating to bilateral legs. Pt alert and able to talk but hard to understand. Pt is bedridden and blind. Pt's adult diaper changed by family member. Family member states that she has been crying since yesterday morning. Pt unable to move legs.

## 2012-01-05 NOTE — ED Notes (Signed)
Dr. Oletta Lamas in to see patient. Pt still c/o lower back pain radiating to bilateral legs.

## 2012-01-05 NOTE — ED Notes (Signed)
Low back and bil leg pain, Has been crying with pain at home.

## 2012-01-05 NOTE — Discharge Instructions (Signed)
 Back Pain, Adult Low back pain is very common. About 1 in 5 people have back pain.The cause of low back pain is rarely dangerous. The pain often gets better over time.About half of people with a sudden onset of back pain feel better in just 2 weeks. About 8 in 10 people feel better by 6 weeks.  CAUSES Some common causes of back pain include:  Strain of the muscles or ligaments supporting the spine.   Wear and tear (degeneration) of the spinal discs.   Arthritis.   Direct injury to the back.  DIAGNOSIS Most of the time, the direct cause of low back pain is not known.However, back pain can be treated effectively even when the exact cause of the pain is unknown.Answering your caregiver's questions about your overall health and symptoms is one of the most accurate ways to make sure the cause of your pain is not dangerous. If your caregiver needs more information, he or she may order lab work or imaging tests (X-rays or MRIs).However, even if imaging tests show changes in your back, this usually does not require surgery. HOME CARE INSTRUCTIONS For many people, back pain returns.Since low back pain is rarely dangerous, it is often a condition that people can learn to [facility] their own.   Remain active. It is stressful on the back to sit or stand in one place. Do not sit, drive, or stand in one place for more than 30 minutes at a time. Take short walks on level surfaces as soon as pain allows.Try to increase the length of time you walk each day.   Do not stay in bed.Resting more than 1 or 2 days can delay your recovery.   Do not avoid exercise or work.Your body is made to move.It is not dangerous to be active, even though your back may hurt.Your back will likely heal faster if you return to being active before your pain is gone.   Pay attention to your body when you bend and lift. Many people have less discomfortwhen lifting if they bend their knees, keep the load close to their  bodies,and avoid twisting. Often, the most comfortable positions are those that put less stress on your recovering back.   Find a comfortable position to sleep. Use a firm mattress and lie on your side with your knees slightly bent. If you lie on your back, put a pillow under your knees.   Only take over-the-counter or prescription medicines as directed by your caregiver. Over-the-counter medicines to reduce pain and inflammation are often the most helpful.Your caregiver may prescribe muscle relaxant drugs.These medicines help dull your pain so you can more quickly return to your normal activities and healthy exercise.   Put ice on the injured area.   Put ice in a plastic bag.   Place a towel between your skin and the bag.   Leave the ice on for 15 to 20 minutes, 3 to 4 times a day for the first 2 to 3 days. After that, ice and heat may be alternated to reduce pain and spasms.   Ask your caregiver about trying back exercises and gentle massage. This may be of some benefit.   Avoid feeling anxious or stressed.Stress increases muscle tension and can worsen back pain.It is important to recognize when you are anxious or stressed and learn ways to manage it.Exercise is a great option.  SEEK MEDICAL CARE IF:  You have pain that is not relieved with rest or medicine.   You have  pain that does not improve in 1 week.   You have new symptoms.   You are generally not feeling well.  SEEK IMMEDIATE MEDICAL CARE IF:   You have pain that radiates from your back into your legs.   You develop new bowel or bladder control problems.   You have unusual weakness or numbness in your arms or legs.   You develop nausea or vomiting.   You develop abdominal pain.   You feel faint.  Document Released: 11/22/2005 Document Revised: 08/04/2011 Document Reviewed: 04/12/2011 [facility] Patient Information 2012 [place], [place].    Narcotic and benzodiazepine use may cause drowsiness, slowed breathing  or dependence.  Please use with caution and do not drive, operate machinery or watch young children alone while taking them.  Taking combinations of these medications or drinking alcohol will potentiate these effects.  Take stool softeners as well to help prevent constipation.  Drink plenty of water.

## 2012-12-06 DIAGNOSIS — N920 Excessive and frequent menstruation with regular cycle: Secondary | ICD-10-CM

## 2012-12-06 HISTORY — DX: Excessive and frequent menstruation with regular cycle: N92.0

## 2012-12-13 NOTE — Patient Instructions (Addendum)
22 Sharon Stevenson  12/13/2012   Your procedure is scheduled on:  12/19/2012  Report to [facility] at  730  AM.  Call this number if you have problems the morning of surgery: [phone]   Remember:   Do not eat food:After Midnight.  May have clear liquids:until Midnight .    Take these medicines the morning of surgery with A SIP OF WATER:  Dilantin,percocet   Do not wear jewelry, make-up or nail polish.  Do not wear lotions, powders, or perfumes.   Do not shave 48 hours prior to surgery. Men may shave face and neck.  Do not bring valuables to the hospital.  Contacts, dentures or bridgework may not be worn into surgery.  Leave suitcase in the car. After surgery it may be brought to your room.  For patients admitted to the hospital, checkout time is 11:00 AM the day of discharge.   Patients discharged the day of surgery will not be allowed to drive home.  Name and phone number of your driver: family  Special Instructions: Shower using CHG 2 nights before surgery and the night before surgery.  If you shower the day of surgery use CHG.  Use special wash - you have one bottle of CHG for all showers.  You should use approximately 1/3 of the bottle for each shower.   Please read over the following fact sheets that you were given: Pain Booklet, Coughing and Deep Breathing, MRSA Information, Surgical Site Infection Prevention, Anesthesia Post-op Instructions and Care and Recovery After Surgery Endometrial Ablation Endometrial ablation removes the lining of the uterus (endometrium). It is usually a same day, outpatient treatment. Ablation helps avoid major surgery (such as a hysterectomy). A hysterectomy is removal of the cervix and uterus. Endometrial ablation has less risk and complications, has a shorter recovery period and is less expensive. After endometrial ablation, most women will have little or no menstrual bleeding. You may not keep your fertility. Pregnancy is no longer likely  after this procedure but if you are pre-menopausal, you still need to use a reliable method of birth control following the procedure because pregnancy can occur. REASONS TO HAVE THE PROCEDURE MAY INCLUDE:  Heavy periods.  Bleeding that is causing anemia.  Anovulatory bleeding, very irregular, bleeding.  Bleeding submucous fibroids (on the lining inside the uterus) if they are smaller than 3 centimeters. REASONS NOT TO HAVE THE PROCEDURE MAY INCLUDE:  You wish to have more children.  You have a pre-cancerous or cancerous problem. The cause of any abnormal bleeding must be diagnosed before having the procedure.  You have pain coming from the uterus.  You have a submucus fibroid larger than 3 centimeters.  You recently had a baby.  You recently had an infection in the uterus.  You have a severe retro-flexed, tipped uterus and cannot insert the instrument to do the ablation.  You had a Cesarean section or deep major surgery on the uterus.  The inner cavity of the uterus is too large for the endometrial ablation instrument. RISKS AND COMPLICATIONS   Perforation of the uterus.  Bleeding.  Infection of the uterus, bladder or vagina.  Injury to surrounding organs.  Cutting the cervix.  An air bubble to the lung (air embolus).  Pregnancy following the procedure.  Failure of the procedure to help the problem requiring hysterectomy.  Decreased ability to diagnose cancer in the lining of the uterus. BEFORE THE PROCEDURE  The lining of  the uterus must be tested to make sure there is no pre-cancerous or cancer cells present.  Medications may be given to make the lining of the uterus thinner.  Ultrasound may be used to evaluate the size and look for abnormalities of the uterus.  Future pregnancy is not desired. PROCEDURE  There are different ways to destroy the lining of the uterus.   Resectoscope - radio frequency-alternating electric current is the most common one  used.  Cryotherapy - freezing the lining of the uterus.  Heated Free Liquid - heated salt (saline) solution inserted into the uterus.  Microwave - uses high energy microwaves in the uterus.  Thermal Balloon - a catheter with a balloon tip is inserted into the uterus and filled with heated fluid. Your caregiver will talk with you about the method used in this clinic. They will also instruct you on the pros and cons of the procedure. Endometrial ablation is performed along with a procedure called operative hysteroscopy. A narrow viewing tube is inserted through the birth canal (vagina) and through the cervix into the uterus. A tiny camera attached to the viewing tube (hysteroscope) allows the uterine cavity to be shown on a TV monitor during surgery. Your uterus is filled with a harmless liquid to make the procedure easier. The lining of the uterus is then removed. The lining can also be removed with a resectoscope which allows your surgeon to cut away the lining of the uterus under direct vision. Usually, you will be able to go home within an hour after the procedure. HOME CARE INSTRUCTIONS   Do not drive for 24 hours.  No tampons, douching or intercourse for 2 weeks or until your caregiver approves.  Rest at home for 24 to 48 hours. You may then resume normal activities unless told differently by your caregiver.  Take your temperature two times a day for 4 days, and record it.  Take any medications your caregiver has ordered, as directed.  Use some form of contraception if you are pre-menopausal and do not want to get pregnant. Bleeding after the procedure is normal. It varies from light spotting and mildly watery to bloody discharge for 4 to 6 weeks. You may also have mild cramping. Only take over-the-counter or prescription medicines for pain, discomfort, or fever as directed by your caregiver. Do not use aspirin, as this may aggravate bleeding. Frequent urination during the first 24 hours  is normal. You will not know how effective your surgery is until at least 3 months after the surgery. SEEK IMMEDIATE MEDICAL CARE IF:   Bleeding is heavier than a normal menstrual cycle.  An oral temperature above 102 F (38.9 C) develops.  You have increasing cramps or pains not relieved with medication or develop belly (abdominal) pain which does not seem to be related to the same area of earlier cramping and pain.  You are light headed, weak or have fainting episodes.  You develop pain in the shoulder strap areas.  You have chest or leg pain.  You have abnormal vaginal discharge.  You have painful urination. Document Released: 10/01/2004 Document Revised: 02/14/2012 Document Reviewed: 12/30/2007 [facility] Patient Information 2013 [place], [place]. Dilation and Curettage or Vacuum Curettage Dilation and curettage (D&C) and vacuum curettage are minor procedures. A D&C involves stretching (dilation) the cervix and scraping (curettage) the inside lining of the womb (uterus). During a D&C, tissue is gently scraped from the inside lining of the uterus. During a vacuum curettage, the lining and tissue in the  uterus are removed with the use of gentle suction. Curettage may be performed for diagnostic or therapeutic purposes. As a diagnostic procedure, curettage is performed for the purpose of examining tissues from the uterus. Tissue examination may help determine causes or treatment options for symptoms. A diagnostic curettage may be performed for the following symptoms:  Irregular bleeding in the uterus.  Bleeding with the development of clots.  Spotting between menstrual periods.  Prolonged menstrual periods.  Bleeding after menopause.  No menstrual period (amenorrhea).  A change in size and shape of the uterus. A therapeutic curettage is performed to remove tissue, blood, or a contraceptive device. Therapeutic curettage may be performed for the following conditions:   Removal of  an IUD (intrauterine device).  Removal of retained placenta after giving birth. Retained placenta can cause bleeding severe enough to require transfusions or an infection.  Abortion.  Miscarriage.  Removal of polyps inside the uterus.  Removal of uncommon types of fibroids (noncancerous lumps). LET YOUR CAREGIVER KNOW ABOUT:   Allergies to food or medicine.  Medicines taken, including vitamins, herbs, eyedrops, over-the-counter medicines, and creams.  Use of steroids (by mouth or creams).  Previous problems with anesthetics or numbing medicines.  History of bleeding problems or blood clots.  Previous surgery.  Other health problems, including diabetes and kidney problems.  Possibility of pregnancy, if this applies. RISKS AND COMPLICATIONS   Excessive bleeding.  Infection of the uterus.  Damage to the cervix.  Development of scar tissue (adhesions) inside the uterus, later causing abnormal amounts of menstrual bleeding.  Complications from the general anesthetic, if a general anesthetic is used.  Putting a hole (perforation) in the uterus. This is rare. BEFORE THE PROCEDURE   Eat and drink before the procedure only as directed by your caregiver.  Arrange for someone to take you home. PROCEDURE   This procedure may be done in a hospital, outpatient clinic, or caregiver's office.  You may be given a general anesthetic or a local anesthetic in and around the cervix.  You will lie on your back with your legs in stirrups.  There are two ways in which your cervix can be softened and dilated. These include:  Taking a medicine.  Having thin rods (laminaria) inserted into your cervix.  A curved tool (curette) will scrape cells from the inside lining of the uterus and will then be removed. This procedure usually takes about 15 to 30 minutes. AFTER THE PROCEDURE   You will rest in the recovery area until you are stable and are ready to go home.  You will need to  have someone take you home.  You may feel sick to your stomach (nauseous) or throw up (vomit) if you had general anesthesia.  You may have a sore throat if a tube was placed in your throat during general anesthesia.  You may have light cramping and bleeding for 2 days to 2 weeks after the procedure.  Your uterus needs to make a new lining after the procedure. This may make your next period late. Document Released: 11/22/2005 Document Revised: 02/14/2012 Document Reviewed: 06/20/2009 [facility] Patient Information 2013 [place], [place]. Hysteroscopy Hysteroscopy is a procedure used for looking inside the womb (uterus). It may be done for many different reasons, including:  To evaluate abnormal bleeding, fibroid (benign, noncancerous) tumors, polyps, scar tissue (adhesions), and possibly cancer of the uterus.  To look for lumps (tumors) and other uterine growths.  To look for causes of why a woman cannot get pregnant (  infertility), causes of recurrent loss of pregnancy (miscarriages), or a lost intrauterine device (IUD).  To perform a sterilization by blocking the fallopian tubes from inside the uterus. A hysteroscopy should be done right after a menstrual period to be sure you are not pregnant. LET YOUR CAREGIVER KNOW ABOUT:   Allergies.  Medicines taken, including herbs, eyedrops, over-the-counter medicines, and creams.  Use of steroids (by mouth or creams).  Previous problems with anesthetics or numbing medicines.  History of bleeding or blood problems.  History of blood clots.  Possibility of pregnancy, if this applies.  Previous surgery.  Other health problems. RISKS AND COMPLICATIONS   Putting a hole in the uterus.  Excessive bleeding.  Infection.  Damage to the cervix.  Injury to other organs.  Allergic reaction to medicines.  Too much fluid used in the uterus for the procedure. BEFORE THE PROCEDURE   Do not take aspirin or blood thinners for a week before  the procedure, or as directed. It can cause bleeding.  Arrive at least 60 minutes before the procedure or as directed to read and sign the necessary forms.  Arrange for someone to take you home after the procedure.  If you smoke, do not smoke for 2 weeks before the procedure. PROCEDURE   Your caregiver may give you medicine to relax you. He or she may also give you a medicine that numbs the area around the cervix (local anesthetic) or a medicine that makes you sleep (general anesthesia).  Sometimes, a medicine is placed in the cervix the day before the procedure. This medicine makes the cervix have a larger opening (dilate). This makes it easier for the instrument to be inserted into the uterus.  A small instrument (hysteroscope) is inserted through the vagina into the uterus. This instrument is similar to a pencil-sized telescope with a light.  During the procedure, air or a liquid is put into the uterus, which allows the surgeon to see better.  Sometimes, tissue is gently scraped from inside the uterus. These tissue samples are sent to a specialist who looks at tissue samples (pathologist). The pathologist will give a report to your caregiver. This will help your caregiver decide if further treatment is necessary. The report will also help your caregiver decide on the best treatment if the test comes back abnormal. AFTER THE PROCEDURE   If you had a general anesthetic, you may be groggy for a couple hours after the procedure.  If you had a local anesthetic, you will be advised to rest at the surgical center or caregiver's office until you are stable and feel ready to go home.  You may have some cramping for a couple days.  You may have bleeding, which varies from light spotting for a few days to menstrual-like bleeding for up to 3 to 7 days. This is normal.  Have someone take you home. FINDING OUT THE RESULTS OF YOUR TEST Not all test results are available during your visit. If your  test results are not back during the visit, make an appointment with your caregiver to find out the results. Do not assume everything is normal if you have not heard from your caregiver or the medical facility. It is important for you to follow up on all of your test results. HOME CARE INSTRUCTIONS   Do not drive for 24 hours or as instructed.  Only take over-the-counter or prescription medicines for pain, discomfort, or fever as directed by your caregiver.  Do not take aspirin. It  can cause or aggravate bleeding.  Do not drive or drink alcohol while taking pain medicine.  You may resume your usual diet.  Do not use tampons, douche, or have sexual intercourse for 2 weeks, or as advised by your caregiver.  Rest and sleep for the first 24 to 48 hours.  Take your temperature twice a day for 4 to 5 days. Write it down. Give these temperatures to your caregiver if they are abnormal (above 98.6 F or 37.0 C).  Take medicines your caregiver has ordered as directed.  Follow your caregiver's advice regarding diet, exercise, lifting, driving, and general activities.  Take showers instead of baths for 2 weeks, or as recommended by your caregiver.  If you develop constipation:  Take a mild laxative with the advice of your caregiver.  Eat bran foods.  Drink enough water and fluids to keep your urine clear or pale yellow.  Try to have someone with you or available to you for the first 24 to 48 hours, especially if you had a general anesthetic.  Make sure you and your family understand everything about your operation and recovery.  Follow your caregiver's advice regarding follow-up appointments and Pap smears. SEEK MEDICAL CARE IF:   You feel dizzy or lightheaded.  You feel sick to your stomach (nauseous).  You develop abnormal vaginal discharge.  You develop a rash.  You have an abnormal reaction or allergy to your medicine.  You need stronger pain medicine. SEEK IMMEDIATE  MEDICAL CARE IF:   Bleeding is heavier than a normal menstrual period or you have blood clots.  You have an oral temperature above 102 F (38.9 C), not controlled by medicine.  You have increasing cramps or pains not relieved with medicine.  You develop belly (abdominal) pain that does not seem to be related to the same area of earlier cramping and pain.  You pass out.  You develop pain in the tops of your shoulders (shoulder strap areas).  You develop shortness of breath. MAKE SURE YOU:   Understand these instructions.  Will watch your condition.  Will get help right away if you are not doing well or get worse. Document Released: 02/28/2001 Document Revised: 02/14/2012 Document Reviewed: 06/23/2009 [facility] Patient Information 2013 [place], [place]. PATIENT INSTRUCTIONS POST-ANESTHESIA  IMMEDIATELY FOLLOWING SURGERY:  Do not drive or operate machinery for the first twenty four hours after surgery.  Do not make any important decisions for twenty four hours after surgery or while taking narcotic pain medications or sedatives.  If you develop intractable nausea and vomiting or a severe headache please notify your doctor immediately.  FOLLOW-UP:  Please make an appointment with your surgeon as instructed. You do not need to follow up with anesthesia unless specifically instructed to do so.  WOUND CARE INSTRUCTIONS (if applicable):  Keep a dry clean dressing on the anesthesia/puncture wound site if there is drainage.  Once the wound has quit draining you may leave it open to air.  Generally you should leave the bandage intact for twenty four hours unless there is drainage.  If the epidural site drains for more than 36-48 hours please call the anesthesia department.  QUESTIONS?:  Please feel free to call your physician or the hospital operator if you have any questions, and they will be happy to assist you.

## 2012-12-14 ENCOUNTER — Other Ambulatory Visit: Payer: Self-pay | Admitting: Obstetrics and Gynecology

## 2012-12-14 ENCOUNTER — Encounter (HOSPITAL_COMMUNITY): Payer: Self-pay

## 2012-12-14 ENCOUNTER — Encounter (HOSPITAL_COMMUNITY): Payer: Self-pay | Admitting: Pharmacy Technician

## 2012-12-14 ENCOUNTER — Encounter (HOSPITAL_COMMUNITY)
Admission: RE | Admit: 2012-12-14 | Discharge: 2012-12-14 | Disposition: A | Discharge status: Home / Self Care | Payer: Medicaid Other | Source: Ambulatory Visit | Attending: Obstetrics and Gynecology | Admitting: Obstetrics and Gynecology

## 2012-12-14 DIAGNOSIS — Z5309 Procedure and treatment not carried out because of other contraindication: Secondary | ICD-10-CM | POA: Insufficient documentation

## 2012-12-14 DIAGNOSIS — Z01812 Encounter for preprocedural laboratory examination: Secondary | ICD-10-CM | POA: Insufficient documentation

## 2012-12-14 DIAGNOSIS — N920 Excessive and frequent menstruation with regular cycle: Secondary | ICD-10-CM | POA: Insufficient documentation

## 2012-12-14 LAB — CBC
HCT: 28.1 % — ABNORMAL LOW (ref 36.0–46.0)
Hemoglobin: 8.1 g/dL — ABNORMAL LOW (ref 12.0–15.0)
MCHC: 28.8 g/dL — ABNORMAL LOW (ref 30.0–36.0)
RBC: 3.58 MIL/uL — ABNORMAL LOW (ref 3.87–5.11)
WBC: 13.1 10*3/uL — ABNORMAL HIGH (ref 4.0–10.5)

## 2012-12-14 LAB — SURGICAL PCR SCREEN: MRSA, PCR: NEGATIVE

## 2012-12-14 LAB — HCG, SERUM, QUALITATIVE: Preg, Serum: NEGATIVE

## 2012-12-14 LAB — RPR: RPR Ser Ql: NONREACTIVE

## 2012-12-18 NOTE — OR Nursing (Signed)
Abnormal labs reported to Dr. Jayme Cloud and Dr. Emelda Fear.  Also  Reported that pt. Takes Plavix 75mg  daily. Hgb. 8.1  And HCT 28.1.

## 2012-12-18 NOTE — OR Nursing (Signed)
Pt. Cancelled for tomarow, 12/19/2012. Will be rescheduled after further work up.  Pt. On Plavix , which needs to be stopped prior to surgery.

## 2012-12-19 ENCOUNTER — Ambulatory Visit (HOSPITAL_COMMUNITY)
Admission: RE | Admit: 2012-12-19 | Payer: Medicaid Other | Source: Ambulatory Visit | Admitting: Obstetrics and Gynecology

## 2012-12-19 SURGERY — DILATATION & CURETTAGE/HYSTEROSCOPY WITH THERMACHOICE ABLATION
Anesthesia: General

## 2012-12-27 NOTE — Patient Instructions (Addendum)
Sharon Stevenson  12/27/2012   Your procedure is scheduled on:   01/02/2013  Report to [facility] at  730  AM.  Call this number if you have problems the morning of surgery: [phone]   Remember:   Do not eat food or drink liquids after midnight.   Take these medicines the morning of surgery with A SIP OF WATER:  Xanax,cymbalta,vicodin,dilantin   Do not wear jewelry, make-up or nail polish.  Do not wear lotions, powders, or perfumes.   Do not shave 48 hours prior to surgery. Men may shave face and neck.  Do not bring valuables to the hospital.  Contacts, dentures or bridgework may not be worn into surgery.  Leave suitcase in the car. After surgery it may be brought to your room.  For patients admitted to the hospital, checkout time is 11:00 AM the day of discharge.   Patients discharged the day of surgery will not be allowed to drive  home.  Name and phone number of your driver: family  Special Instructions: Shower using CHG 2 nights before surgery and the night before surgery.  If you shower the day of surgery use CHG.  Use special wash - you have one bottle of CHG for all showers.  You should use approximately 1/3 of the bottle for each shower.   Please read over the following fact sheets that you were given: Pain Booklet, Coughing and Deep Breathing, MRSA Information, Surgical Site Infection Prevention, Anesthesia Post-op Instructions and Care and Recovery After Surgery Endometrial Ablation Endometrial ablation removes the lining of the uterus (endometrium). It is usually a same day, outpatient treatment. Ablation helps avoid major surgery (such as a hysterectomy). A hysterectomy is removal of the cervix and uterus. Endometrial ablation has less risk and complications, has a shorter recovery period and is less expensive. After endometrial ablation, most women will have little or no menstrual bleeding. You may not keep your fertility. Pregnancy is no longer likely after this  procedure but if you are pre-menopausal, you still need to use a reliable method of birth control following the procedure because pregnancy can occur. REASONS TO HAVE THE PROCEDURE MAY INCLUDE:  Heavy periods.  Bleeding that is causing anemia.  Anovulatory bleeding, very irregular, bleeding.  Bleeding submucous fibroids (on the lining inside the uterus) if they are smaller than 3 centimeters. REASONS NOT TO HAVE THE PROCEDURE MAY INCLUDE:  You wish to have more children.  You have a pre-cancerous or cancerous problem. The cause of any abnormal bleeding must be diagnosed before having the procedure.  You have pain coming from the uterus.  You have a submucus fibroid larger than 3 centimeters.  You recently had a baby.  You recently had an infection in the uterus.  You have a severe retro-flexed, tipped uterus and cannot insert the instrument to do the ablation.  You had a Cesarean section or deep major surgery on the uterus.  The inner cavity of the uterus is too large for the endometrial ablation instrument. RISKS AND COMPLICATIONS   Perforation of the uterus.  Bleeding.  Infection of the uterus, bladder or vagina.  Injury to surrounding organs.  Cutting the cervix.  An air bubble to the lung (air embolus).  Pregnancy following the procedure.  Failure of the procedure to help the problem requiring hysterectomy.  Decreased ability to diagnose cancer in the lining of the uterus. BEFORE THE PROCEDURE  The lining of the uterus  must be tested to make sure there is no pre-cancerous or cancer cells present.  Medications may be given to make the lining of the uterus thinner.  Ultrasound may be used to evaluate the size and look for abnormalities of the uterus.  Future pregnancy is not desired. PROCEDURE  There are different ways to destroy the lining of the uterus.   Resectoscope - radio frequency-alternating electric current is the most common one  used.  Cryotherapy - freezing the lining of the uterus.  Heated Free Liquid - heated salt (saline) solution inserted into the uterus.  Microwave - uses high energy microwaves in the uterus.  Thermal Balloon - a catheter with a balloon tip is inserted into the uterus and filled with heated fluid. Your caregiver will talk with you about the method used in this clinic. They will also instruct you on the pros and cons of the procedure. Endometrial ablation is performed along with a procedure called operative hysteroscopy. A narrow viewing tube is inserted through the birth canal (vagina) and through the cervix into the uterus. A tiny camera attached to the viewing tube (hysteroscope) allows the uterine cavity to be shown on a TV monitor during surgery. Your uterus is filled with a harmless liquid to make the procedure easier. The lining of the uterus is then removed. The lining can also be removed with a resectoscope which allows your surgeon to cut away the lining of the uterus under direct vision. Usually, you will be able to go home within an hour after the procedure. HOME CARE INSTRUCTIONS   Do not drive for 24 hours.  No tampons, douching or intercourse for 2 weeks or until your caregiver approves.  Rest at home for 24 to 48 hours. You may then resume normal activities unless told differently by your caregiver.  Take your temperature two times a day for 4 days, and record it.  Take any medications your caregiver has ordered, as directed.  Use some form of contraception if you are pre-menopausal and do not want to get pregnant. Bleeding after the procedure is normal. It varies from light spotting and mildly watery to bloody discharge for 4 to 6 weeks. You may also have mild cramping. Only take over-the-counter or prescription medicines for pain, discomfort, or fever as directed by your caregiver. Do not use aspirin, as this may aggravate bleeding. Frequent urination during the first 24 hours  is normal. You will not know how effective your surgery is until at least 3 months after the surgery. SEEK IMMEDIATE MEDICAL CARE IF:   Bleeding is heavier than a normal menstrual cycle.  An oral temperature above 102 F (38.9 C) develops.  You have increasing cramps or pains not relieved with medication or develop belly (abdominal) pain which does not seem to be related to the same area of earlier cramping and pain.  You are light headed, weak or have fainting episodes.  You develop pain in the shoulder strap areas.  You have chest or leg pain.  You have abnormal vaginal discharge.  You have painful urination. Document Released: 10/01/2004 Document Revised: 02/14/2012 Document Reviewed: 12/30/2007 [facility] Patient Information 2013 [place], [place]. Hysteroscopy Hysteroscopy is a procedure used for looking inside the womb (uterus). It may be done for many different reasons, including:  To evaluate abnormal bleeding, fibroid (benign, noncancerous) tumors, polyps, scar tissue (adhesions), and possibly cancer of the uterus.  To look for lumps (tumors) and other uterine growths.  To look for causes of why a woman  cannot get pregnant (infertility), causes of recurrent loss of pregnancy (miscarriages), or a lost intrauterine device (IUD).  To perform a sterilization by blocking the fallopian tubes from inside the uterus. A hysteroscopy should be done right after a menstrual period to be sure you are not pregnant. LET YOUR CAREGIVER KNOW ABOUT:   Allergies.  Medicines taken, including herbs, eyedrops, over-the-counter medicines, and creams.  Use of steroids (by mouth or creams).  Previous problems with anesthetics or numbing medicines.  History of bleeding or blood problems.  History of blood clots.  Possibility of pregnancy, if this applies.  Previous surgery.  Other health problems. RISKS AND COMPLICATIONS   Putting a hole in the uterus.  Excessive  bleeding.  Infection.  Damage to the cervix.  Injury to other organs.  Allergic reaction to medicines.  Too much fluid used in the uterus for the procedure. BEFORE THE PROCEDURE   Do not take aspirin or blood thinners for a week before the procedure, or as directed. It can cause bleeding.  Arrive at least 60 minutes before the procedure or as directed to read and sign the necessary forms.  Arrange for someone to take you home after the procedure.  If you smoke, do not smoke for 2 weeks before the procedure. PROCEDURE   Your caregiver may give you medicine to relax you. He or she may also give you a medicine that numbs the area around the cervix (local anesthetic) or a medicine that makes you sleep (general anesthesia).  Sometimes, a medicine is placed in the cervix the day before the procedure. This medicine makes the cervix have a larger opening (dilate). This makes it easier for the instrument to be inserted into the uterus.  A small instrument (hysteroscope) is inserted through the vagina into the uterus. This instrument is similar to a pencil-sized telescope with a light.  During the procedure, air or a liquid is put into the uterus, which allows the surgeon to see better.  Sometimes, tissue is gently scraped from inside the uterus. These tissue samples are sent to a specialist who looks at tissue samples (pathologist). The pathologist will give a report to your caregiver. This will help your caregiver decide if further treatment is necessary. The report will also help your caregiver decide on the best treatment if the test comes back abnormal. AFTER THE PROCEDURE   If you had a general anesthetic, you may be groggy for a couple hours after the procedure.  If you had a local anesthetic, you will be advised to rest at the surgical center or caregiver's office until you are stable and feel ready to go home.  You may have some cramping for a couple days.  You may have  bleeding, which varies from light spotting for a few days to menstrual-like bleeding for up to 3 to 7 days. This is normal.  Have someone take you home. FINDING OUT THE RESULTS OF YOUR TEST Not all test results are available during your visit. If your test results are not back during the visit, make an appointment with your caregiver to find out the results. Do not assume everything is normal if you have not heard from your caregiver or the medical facility. It is important for you to follow up on all of your test results. HOME CARE INSTRUCTIONS   Do not drive for 24 hours or as instructed.  Only take over-the-counter or prescription medicines for pain, discomfort, or fever as directed by your caregiver.  Do not  take aspirin. It can cause or aggravate bleeding.  Do not drive or drink alcohol while taking pain medicine.  You may resume your usual diet.  Do not use tampons, douche, or have sexual intercourse for 2 weeks, or as advised by your caregiver.  Rest and sleep for the first 24 to 48 hours.  Take your temperature twice a day for 4 to 5 days. Write it down. Give these temperatures to your caregiver if they are abnormal (above 98.6 F or 37.0 C).  Take medicines your caregiver has ordered as directed.  Follow your caregiver's advice regarding diet, exercise, lifting, driving, and general activities.  Take showers instead of baths for 2 weeks, or as recommended by your caregiver.  If you develop constipation:  Take a mild laxative with the advice of your caregiver.  Eat bran foods.  Drink enough water and fluids to keep your urine clear or pale yellow.  Try to have someone with you or available to you for the first 24 to 48 hours, especially if you had a general anesthetic.  Make sure you and your family understand everything about your operation and recovery.  Follow your caregiver's advice regarding follow-up appointments and Pap smears. SEEK MEDICAL CARE IF:   You  feel dizzy or lightheaded.  You feel sick to your stomach (nauseous).  You develop abnormal vaginal discharge.  You develop a rash.  You have an abnormal reaction or allergy to your medicine.  You need stronger pain medicine. SEEK IMMEDIATE MEDICAL CARE IF:   Bleeding is heavier than a normal menstrual period or you have blood clots.  You have an oral temperature above 102 F (38.9 C), not controlled by medicine.  You have increasing cramps or pains not relieved with medicine.  You develop belly (abdominal) pain that does not seem to be related to the same area of earlier cramping and pain.  You pass out.  You develop pain in the tops of your shoulders (shoulder strap areas).  You develop shortness of breath. MAKE SURE YOU:   Understand these instructions.  Will watch your condition.  Will get help right away if you are not doing well or get worse. Document Released: 02/28/2001 Document Revised: 02/14/2012 Document Reviewed: 06/23/2009 [facility] Patient Information 2013 [place], [place]. Dilation and Curettage or Vacuum Curettage Dilation and curettage (D&C) and vacuum curettage are minor procedures. A D&C involves stretching (dilation) the cervix and scraping (curettage) the inside lining of the womb (uterus). During a D&C, tissue is gently scraped from the inside lining of the uterus. During a vacuum curettage, the lining and tissue in the uterus are removed with the use of gentle suction. Curettage may be performed for diagnostic or therapeutic purposes. As a diagnostic procedure, curettage is performed for the purpose of examining tissues from the uterus. Tissue examination may help determine causes or treatment options for symptoms. A diagnostic curettage may be performed for the following symptoms:  Irregular bleeding in the uterus.  Bleeding with the development of clots.  Spotting between menstrual periods.  Prolonged menstrual periods.  Bleeding after  menopause.  No menstrual period (amenorrhea).  A change in size and shape of the uterus. A therapeutic curettage is performed to remove tissue, blood, or a contraceptive device. Therapeutic curettage may be performed for the following conditions:   Removal of an IUD (intrauterine device).  Removal of retained placenta after giving birth. Retained placenta can cause bleeding severe enough to require transfusions or an infection.  Abortion.  Miscarriage.  Removal of polyps inside the uterus.  Removal of uncommon types of fibroids (noncancerous lumps). LET YOUR CAREGIVER KNOW ABOUT:   Allergies to food or medicine.  Medicines taken, including vitamins, herbs, eyedrops, over-the-counter medicines, and creams.  Use of steroids (by mouth or creams).  Previous problems with anesthetics or numbing medicines.  History of bleeding problems or blood clots.  Previous surgery.  Other health problems, including diabetes and kidney problems.  Possibility of pregnancy, if this applies. RISKS AND COMPLICATIONS   Excessive bleeding.  Infection of the uterus.  Damage to the cervix.  Development of scar tissue (adhesions) inside the uterus, later causing abnormal amounts of menstrual bleeding.  Complications from the general anesthetic, if a general anesthetic is used.  Putting a hole (perforation) in the uterus. This is rare. BEFORE THE PROCEDURE   Eat and drink before the procedure only as directed by your caregiver.  Arrange for someone to take you home. PROCEDURE   This procedure may be done in a hospital, outpatient clinic, or caregiver's office.  You may be given a general anesthetic or a local anesthetic in and around the cervix.  You will lie on your back with your legs in stirrups.  There are two ways in which your cervix can be softened and dilated. These include:  Taking a medicine.  Having thin rods (laminaria) inserted into your cervix.  A curved tool  (curette) will scrape cells from the inside lining of the uterus and will then be removed. This procedure usually takes about 15 to 30 minutes. AFTER THE PROCEDURE   You will rest in the recovery area until you are stable and are ready to go home.  You will need to have someone take you home.  You may feel sick to your stomach (nauseous) or throw up (vomit) if you had general anesthesia.  You may have a sore throat if a tube was placed in your throat during general anesthesia.  You may have light cramping and bleeding for 2 days to 2 weeks after the procedure.  Your uterus needs to make a new lining after the procedure. This may make your next period late. Document Released: 11/22/2005 Document Revised: 02/14/2012 Document Reviewed: 06/20/2009 [facility] Patient Information 2013 [place], [place]. PATIENT INSTRUCTIONS POST-ANESTHESIA  IMMEDIATELY FOLLOWING SURGERY:  Do not drive or operate machinery for the first twenty four hours after surgery.  Do not make any important decisions for twenty four hours after surgery or while taking narcotic pain medications or sedatives.  If you develop intractable nausea and vomiting or a severe headache please notify your doctor immediately.  FOLLOW-UP:  Please make an appointment with your surgeon as instructed. You do not need to follow up with anesthesia unless specifically instructed to do so.  WOUND CARE INSTRUCTIONS (if applicable):  Keep a dry clean dressing on the anesthesia/puncture wound site if there is drainage.  Once the wound has quit draining you may leave it open to air.  Generally you should leave the bandage intact for twenty four hours unless there is drainage.  If the epidural site drains for more than 36-48 hours please call the anesthesia department.  QUESTIONS?:  Please feel free to call your physician or the hospital operator if you have any questions, and they will be happy to assist you.

## 2012-12-28 ENCOUNTER — Encounter (HOSPITAL_COMMUNITY): Payer: Self-pay

## 2012-12-28 ENCOUNTER — Encounter (HOSPITAL_COMMUNITY): Payer: Self-pay | Admitting: Pharmacy Technician

## 2012-12-28 ENCOUNTER — Encounter (HOSPITAL_COMMUNITY)
Admission: RE | Admit: 2012-12-28 | Discharge: 2012-12-28 | Disposition: A | Discharge status: Home / Self Care | Payer: Medicaid Other | Source: Ambulatory Visit | Attending: Obstetrics and Gynecology | Admitting: Obstetrics and Gynecology

## 2012-12-28 LAB — CBC
Hemoglobin: 8.1 g/dL — ABNORMAL LOW (ref 12.0–15.0)
MCH: 21.4 pg — ABNORMAL LOW (ref 26.0–34.0)
MCV: 75.5 fL — ABNORMAL LOW (ref 78.0–100.0)
Platelets: 625 10*3/uL — ABNORMAL HIGH (ref 150–400)
RBC: 3.79 MIL/uL — ABNORMAL LOW (ref 3.87–5.11)

## 2013-01-01 ENCOUNTER — Encounter (HOSPITAL_COMMUNITY): Payer: Self-pay | Admitting: Obstetrics and Gynecology

## 2013-01-01 NOTE — Pre-Procedure Instructions (Signed)
Labs repeated on 12/28/2012.  Hgb 8.1 Hct 28.6  Platlets 625.  Dr. Marcos Eke notified of lab results.  Plan is to check labs on arrival with a possible type and cross on arrival.

## 2013-01-01 NOTE — H&P (Signed)
Sharon Stevenson is an 52 y.o. female. 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  Pertinent Gynecological History: Menses: flow is excessive with use of both pads or tampons on heaviest days Bleeding: dysfunctional uterine bleeding and heavy bleeding with clots  Contraception: tubal ligation DES exposure: unknown Blood transfusions: none Sexually transmitted diseases: no past history Previous GYN Procedures: endometrial biopsy  Last mammogram:  Date:  Last pap: will do prior to ablation if not bleeding Date:  OB History: G2, P2   Menstrual History: Menarche age:  Patient's last menstrual period was 12/06/2012.    Past Medical History  Diagnosis Date  . Stroke     Past Surgical History  Procedure Date  . Cesarean section   . Tubal ligation     No family history on file.  Social History:  reports that she has quit smoking. She does not have any smokeless tobacco history on file. She reports that she does not drink alcohol or use illicit drugs.  Allergies: No Known Allergies  No prescriptions prior to admission    Review of Systems  Genitourinary: Positive for frequency.       Loss of urine control s/p stroke    Last menstrual period 12/06/2012. Physical Exam  Constitutional: She is oriented to person, place, and time. She appears well-developed and well-nourished.       Obvious infirmity, loss of motor control.Requires wheelchair or stretcher. Aphasic, but comprehends conversation.  Limited vocalization of sounds, no formed words.   Cardiovascular: Normal rate and regular rhythm.   Respiratory: Effort normal and breath sounds normal.  GI: Soft.  Genitourinary: Vagina normal and uterus normal.       Endometrial biopsy  done, "disordered Proliferative endometrium with breakdown."  Musculoskeletal: Normal range of motion.  Neurological: She is alert and oriented to person, place, and time.    No results found for this or any previous visit (from the past 24 hour(s)).  No results found.  Assessment/Plan: Menorrhagia, anemia For Endometrial ablation,   Deaunte Dente V 01/01/2013, 5:04 PM

## 2013-01-02 ENCOUNTER — Encounter (HOSPITAL_COMMUNITY): Payer: Self-pay | Admitting: *Deleted

## 2013-01-02 ENCOUNTER — Encounter (HOSPITAL_COMMUNITY): Payer: Self-pay | Admitting: Anesthesiology

## 2013-01-02 ENCOUNTER — Ambulatory Visit (HOSPITAL_COMMUNITY): Payer: Medicaid Other | Admitting: Anesthesiology

## 2013-01-02 ENCOUNTER — Encounter (HOSPITAL_COMMUNITY)
Admission: RE | Disposition: A | Discharge status: Home / Self Care | Payer: Self-pay | Source: Ambulatory Visit | Attending: Obstetrics and Gynecology

## 2013-01-02 ENCOUNTER — Ambulatory Visit (HOSPITAL_COMMUNITY)
Admission: RE | Admit: 2013-01-02 | Discharge: 2013-01-02 | Disposition: A | Discharge status: Home / Self Care | Payer: Medicaid Other | Source: Ambulatory Visit | Attending: Obstetrics and Gynecology | Admitting: Obstetrics and Gynecology

## 2013-01-02 DIAGNOSIS — N938 Other specified abnormal uterine and vaginal bleeding: Secondary | ICD-10-CM | POA: Insufficient documentation

## 2013-01-02 DIAGNOSIS — N920 Excessive and frequent menstruation with regular cycle: Secondary | ICD-10-CM | POA: Diagnosis present

## 2013-01-02 DIAGNOSIS — N949 Unspecified condition associated with female genital organs and menstrual cycle: Secondary | ICD-10-CM | POA: Insufficient documentation

## 2013-01-02 DIAGNOSIS — Z01812 Encounter for preprocedural laboratory examination: Secondary | ICD-10-CM | POA: Insufficient documentation

## 2013-01-02 HISTORY — DX: Excessive and frequent menstruation with regular cycle: N92.0

## 2013-01-02 HISTORY — PX: DILITATION & CURRETTAGE/HYSTROSCOPY WITH THERMACHOICE ABLATION: SHX5569

## 2013-01-02 LAB — CBC
HCT: 31.1 % — ABNORMAL LOW (ref 36.0–46.0)
MCH: 20.8 pg — ABNORMAL LOW (ref 26.0–34.0)
MCHC: 28 g/dL — ABNORMAL LOW (ref 30.0–36.0)
MCV: 74.4 fL — ABNORMAL LOW (ref 78.0–100.0)
RDW: 17 % — ABNORMAL HIGH (ref 11.5–15.5)

## 2013-01-02 SURGERY — DILATATION & CURETTAGE/HYSTEROSCOPY WITH THERMACHOICE ABLATION
Anesthesia: General | Site: Uterus | Wound class: Clean Contaminated

## 2013-01-02 MED ORDER — SODIUM CHLORIDE 0.9 % IR SOLN
Status: DC | PRN
  Administered 2013-01-02: 1000 mL

## 2013-01-02 MED ORDER — CEFAZOLIN SODIUM-DEXTROSE 2-3 GM-% IV SOLR
INTRAVENOUS | Status: AC
  Filled 2013-01-02: qty 50

## 2013-01-02 MED ORDER — CEFAZOLIN SODIUM-DEXTROSE 2-3 GM-% IV SOLR: 2.0000 g | INTRAVENOUS | Status: DC

## 2013-01-02 MED ORDER — ONDANSETRON HCL 4 MG/2ML IJ SOLN: 4.0000 mg | Freq: Once | INTRAMUSCULAR | Status: DC | PRN

## 2013-01-02 MED ORDER — ONDANSETRON HCL 4 MG/2ML IJ SOLN
4.0000 mg | Freq: Once | INTRAMUSCULAR | Status: AC
  Administered 2013-01-02: 4 mg via INTRAVENOUS

## 2013-01-02 MED ORDER — ONDANSETRON HCL 4 MG/2ML IJ SOLN
INTRAMUSCULAR | Status: AC
  Filled 2013-01-02: qty 2

## 2013-01-02 MED ORDER — LACTATED RINGERS IV SOLN
INTRAVENOUS | Status: DC
  Administered 2013-01-02: 09:00:00 via INTRAVENOUS

## 2013-01-02 MED ORDER — MIDAZOLAM HCL 2 MG/2ML IJ SOLN
1.0000 mg | INTRAMUSCULAR | Status: DC | PRN
  Administered 2013-01-02: 2 mg via INTRAVENOUS

## 2013-01-02 MED ORDER — FENTANYL CITRATE 0.05 MG/ML IJ SOLN: 25.0000 ug | INTRAMUSCULAR | Status: DC | PRN

## 2013-01-02 MED ORDER — LIDOCAINE HCL (PF) 1 % IJ SOLN
INTRAMUSCULAR | Status: AC
  Filled 2013-01-02: qty 5

## 2013-01-02 MED ORDER — FENTANYL CITRATE 0.05 MG/ML IJ SOLN
INTRAMUSCULAR | Status: DC | PRN
  Administered 2013-01-02 (×2): 50 ug via INTRAVENOUS

## 2013-01-02 MED ORDER — FENTANYL CITRATE 0.05 MG/ML IJ SOLN
INTRAMUSCULAR | Status: AC
  Filled 2013-01-02: qty 2

## 2013-01-02 MED ORDER — MIDAZOLAM HCL 2 MG/2ML IJ SOLN
INTRAMUSCULAR | Status: AC
  Filled 2013-01-02: qty 2

## 2013-01-02 MED ORDER — CEFAZOLIN SODIUM-DEXTROSE 2-3 GM-% IV SOLR
INTRAVENOUS | Status: DC | PRN
  Administered 2013-01-02: 2 g via INTRAVENOUS

## 2013-01-02 MED ORDER — BUPIVACAINE-EPINEPHRINE PF 0.5-1:200000 % IJ SOLN
INTRAMUSCULAR | Status: AC
  Filled 2013-01-02: qty 10

## 2013-01-02 MED ORDER — PROPOFOL 10 MG/ML IV BOLUS
INTRAVENOUS | Status: DC | PRN
Start: 2013-01-02 — End: 2013-01-02
  Administered 2013-01-02: 140 mg via INTRAVENOUS

## 2013-01-02 MED ORDER — DEXTROSE 5 % IV SOLN
INTRAVENOUS | Status: DC | PRN
  Administered 2013-01-02: 14 mL

## 2013-01-02 MED ORDER — LIDOCAINE HCL 1 % IJ SOLN
INTRAMUSCULAR | Status: DC | PRN
  Administered 2013-01-02: 40 mg via INTRADERMAL

## 2013-01-02 MED ORDER — MIDAZOLAM HCL 5 MG/5ML IJ SOLN
INTRAMUSCULAR | Status: DC | PRN
  Administered 2013-01-02: 2 mg via INTRAVENOUS

## 2013-01-02 MED ORDER — BUPIVACAINE-EPINEPHRINE 0.5% -1:200000 IJ SOLN
INTRAMUSCULAR | Status: DC | PRN
  Administered 2013-01-02: 28 mL

## 2013-01-02 MED ORDER — OXYCODONE-ACETAMINOPHEN 5-325 MG PO TABS: 1.0000 | ORAL_TABLET | ORAL | Status: DC | PRN

## 2013-01-02 MED ORDER — POLYETHYLENE GLYCOL 3350 17 GM/SCOOP PO POWD: 17.0000 g | Freq: Every day | ORAL | Status: DC

## 2013-01-02 MED ORDER — PROPOFOL 10 MG/ML IV EMUL
INTRAVENOUS | Status: AC
  Filled 2013-01-02: qty 20

## 2013-01-02 SURGICAL SUPPLY — 31 items
BAG DECANTER FOR FLEXI CONT (MISCELLANEOUS) ×2 IMPLANT
BAG HAMPER (MISCELLANEOUS) ×2 IMPLANT
CATH ROBINSON RED A/P 14FR (CATHETERS) ×2 IMPLANT
CATH ROBINSON RED A/P 16FR (CATHETERS) ×2 IMPLANT
CATH THERMACHOICE III (CATHETERS) ×2 IMPLANT
CLOTH BEACON ORANGE TIMEOUT ST (SAFETY) ×2 IMPLANT
COVER LIGHT HANDLE STERIS (MISCELLANEOUS) ×4 IMPLANT
FORMALIN 10 PREFIL 120ML (MISCELLANEOUS) ×2 IMPLANT
GLOVE BIOGEL PI IND STRL 7.0 (GLOVE) ×1 IMPLANT
GLOVE BIOGEL PI INDICATOR 7.0 (GLOVE) ×1
GLOVE ECLIPSE 6.5 STRL STRAW (GLOVE) ×2 IMPLANT
GLOVE ECLIPSE 7.0 STRL STRAW (GLOVE) ×2 IMPLANT
GLOVE ECLIPSE 9.0 STRL (GLOVE) ×2 IMPLANT
GLOVE INDICATOR 7.5 STRL GRN (GLOVE) ×2 IMPLANT
GLOVE INDICATOR 8.0 STRL GRN (GLOVE) ×2 IMPLANT
GLOVE INDICATOR STER SZ 9 (GLOVE) ×2 IMPLANT
GOWN STRL REIN 3XL LVL4 (GOWN DISPOSABLE) ×2 IMPLANT
GOWN STRL REIN XL XLG (GOWN DISPOSABLE) ×4 IMPLANT
INST SET HYSTEROSCOPY (KITS) ×2 IMPLANT
IV D5W 500ML (IV SOLUTION) ×2 IMPLANT
IV NS IRRIG 3000ML ARTHROMATIC (IV SOLUTION) ×2 IMPLANT
KIT ROOM TURNOVER AP CYSTO (KITS) ×2 IMPLANT
MANIFOLD NEPTUNE II (INSTRUMENTS) ×2 IMPLANT
MANIFOLD NEPTUNE WASTE (CANNULA) ×2 IMPLANT
NS IRRIG 1000ML POUR BTL (IV SOLUTION) ×2 IMPLANT
PACK PERI GYN (CUSTOM PROCEDURE TRAY) ×2 IMPLANT
PAD ARMBOARD 7.5X6 YLW CONV (MISCELLANEOUS) ×2 IMPLANT
PAD TELFA 3X4 1S STER (GAUZE/BANDAGES/DRESSINGS) ×2 IMPLANT
SET BASIN LINEN APH (SET/KITS/TRAYS/PACK) ×2 IMPLANT
SET IRRIG Y TYPE TUR BLADDER L (SET/KITS/TRAYS/PACK) ×2 IMPLANT
SYR CONTROL 10ML LL (SYRINGE) ×2 IMPLANT

## 2013-01-02 NOTE — Interval H&P Note (Signed)
History and Physical Interval Note:  01/02/2013 8:57 AM  Lucia Gaskins  has presented today for surgery, with the diagnosis of dysfunctional uterine bleeding, menorrhagia  The various methods of treatment have been discussed with the patient and family. After consideration of risks, benefits and other options for treatment, the patient has consented to  Procedure(s) (LRB) with comments: DILATATION & CURETTAGE/HYSTEROSCOPY WITH THERMACHOICE ABLATION (N/A) as a surgical intervention .  The patient's history has been reviewed, patient examined, no change in status, stable for surgery.  I have reviewed the patient's chart and labs.  Questions were answered to the patient's and husband's satisfaction.  Her last menses was late in December. Her last Plavix was 12 days ago.   [name]

## 2013-01-02 NOTE — Anesthesia Postprocedure Evaluation (Signed)
  Anesthesia Post-op Note  Patient: [name]  Procedure(s) Performed: Procedure(s) (LRB) with comments: DILATATION & CURETTAGE/HYSTEROSCOPY WITH THERMACHOICE ABLATION (N/A) - Total Ablation Time = 12 minutes 36 seconds     ; 86-88 deg C  Patient Location: PACU  Anesthesia Type:General  Level of Consciousness: awake, alert , oriented and patient cooperative  Airway and Oxygen Therapy: Patient Spontanous Breathing  Post-op Pain: 2 /10, mild  Post-op Assessment: Post-op Vital signs reviewed, Patient's Cardiovascular Status Stable, Respiratory Function Stable, Patent Airway and Pain level controlled  Post-op Vital Signs: Reviewed and stable  Complications: No apparent anesthesia complications

## 2013-01-02 NOTE — Anesthesia Preprocedure Evaluation (Signed)
Anesthesia Evaluation  Patient identified by MRN, date of birth, ID band Patient awake    Reviewed: Allergy & Precautions, H&P , NPO status , Patient's Chart, lab work & pertinent test results  Airway Mallampati: I TM Distance: >3 FB     Dental  (+) Edentulous Upper and Edentulous Lower   Pulmonary  breath sounds clear to auscultation        Cardiovascular negative cardio ROS  Rhythm:Regular Rate:Normal     Neuro/Psych CVA (aphasia, L hemiparesis), Residual Symptoms    GI/Hepatic negative GI ROS,   Endo/Other    Renal/GU      Musculoskeletal   Abdominal   Peds  Hematology   Anesthesia Other Findings   Reproductive/Obstetrics                           Anesthesia Physical Anesthesia Plan  ASA: III  Anesthesia Plan: General   Post-op Pain Management:    Induction: Intravenous  Airway Management Planned: LMA  Additional Equipment:   Intra-op Plan:   Post-operative Plan: Extubation in OR  Informed Consent: I have reviewed the patients History and Physical, chart, labs and discussed the procedure including the risks, benefits and alternatives for the proposed anesthesia with the patient or authorized representative who has indicated his/her understanding and acceptance.     Plan Discussed with:   Anesthesia Plan Comments:         Anesthesia Quick Evaluation

## 2013-01-02 NOTE — Op Note (Signed)
Procedure: Hysteroscopy dilation and curettage, endometrial ablation, using Gynecare ThermaChoice 3 device  Patient was taken to the operating room prepped and draped for vaginal procedure with timeout conducted. The speculum was inserted and due to marked vaginal length there was some difficulty in identifying the cervix. Single-tooth tenaculum x2 was used to pull the cervix into view. There was significant constipation interfering with uterine mobility. These uterus was sounded to 8 cm in the anteflexed position, dilated to 25 [place] then a rigid 30 operative hysteroscope inserted and the endometrial cavity visualized. Tubal ostia were visualized. No evidence of trauma associated with dilation was noted. The Gynecare ThermaChoice 3 endometrial ablation device was then activated inserted and the ablation sequence initiated. Due to the electrical supply to the machine being temporarily interrupted at approximate 4 minutes into the procedure, we were required to perform a restart of the 8 minute ablation sequence, with the 8 minute ablation completed and the 14 cc of fluid used, D5W, removed. Patient went to recovery room in stable condition sponge and needle counts correct

## 2013-01-02 NOTE — Brief Op Note (Signed)
01/02/2013  10:46 AM  PATIENT:  Sharon Stevenson  52 y.o. female  PRE-OPERATIVE DIAGNOSIS:  dysfunctional uterine bleeding Menorrhagia POST-OPERATIVE DIAGNOSIS:  dysfunctional uterine bleeding, menorrhagia  PROCEDURE:  Procedure(s) (LRB) with comments: DILATATION & CURETTAGE/HYSTEROSCOPY WITH THERMACHOICE ABLATION (N/A) - Total Ablation Time = 12 minutes 36 seconds     ; 86-88 deg C  SURGEON:  Surgeon(s) and Role:    * [name], MD - Primary  PHYSICIAN ASSISTANT:   ASSISTANTS: Kendrick  CST   ANESTHESIA:   general and paracervical block  EBL:  Total I/O In: 450 [I.V.:450] Out: -   BLOOD ADMINISTERED:none  DRAINS: none   LOCAL MEDICATIONS USED:  MARCAINE    and Amount: 27ml  SPECIMEN:  No Specimen  DISPOSITION OF SPECIMEN:  PATHOLOGY  COUNTS:  YES  TOURNIQUET:  * No tourniquets in log *  DICTATION: .Dragon Dictation  PLAN OF CARE: Discharge to home after PACU  PATIENT DISPOSITION:  PACU - hemodynamically stable.   Delay start of Pharmacological VTE agent (>24hrs) due to surgical blood loss or risk of bleeding: not applicable

## 2013-01-02 NOTE — Anesthesia Procedure Notes (Signed)
Procedure Name: LMA Insertion Date/Time: 01/02/2013 9:26 AM Performed by: Despina Hidden Pre-anesthesia Checklist: Emergency Drugs available, Suction available, Patient identified and Patient being monitored Patient Re-evaluated:Patient Re-evaluated prior to inductionOxygen Delivery Method: Circle system utilized Preoxygenation: Pre-oxygenation with 100% oxygen Intubation Type: IV induction Ventilation: Mask ventilation without difficulty LMA: LMA inserted LMA Size: 3.0 Tube type: Oral Number of attempts: 1 Placement Confirmation: positive ETCO2 and breath sounds checked- equal and bilateral Tube secured with: Tape Dental Injury: Teeth and Oropharynx as per pre-operative assessment

## 2013-01-02 NOTE — Preoperative (Signed)
Beta Blockers   Reason not to administer Beta Blockers:Not Applicable 

## 2013-01-02 NOTE — Transfer of Care (Signed)
Immediate Anesthesia Transfer of Care Note  Patient: Sharon Stevenson  Procedure(s) Performed: Procedure(s) (LRB) with comments: DILATATION & CURETTAGE/HYSTEROSCOPY WITH THERMACHOICE ABLATION (N/A) - Total Ablation Time = 12 minutes 36 seconds     ; 86-88 deg C  Patient Location: PACU  Anesthesia Type:General  Level of Consciousness: awake and patient cooperative  Airway & Oxygen Therapy: Patient Spontanous Breathing and Patient connected to face mask oxygen  Post-op Assessment: Report given to PACU RN and Post -op Vital signs reviewed and stable  Post vital signs: Reviewed and stable  Complications: No apparent anesthesia complications

## 2013-01-02 NOTE — Interval H&P Note (Signed)
History and Physical Interval Note:  01/02/2013 8:58 AM  Sharon Stevenson  has presented today for surgery, with the diagnosis of dysfunctional uterine bleeding  The various methods of treatment have been discussed with the patient and family. After consideration of risks, benefits and other options for treatment, the patient has consented to  Procedure(s) (LRB) with comments: DILATATION & CURETTAGE/HYSTEROSCOPY WITH THERMACHOICE ABLATION (N/A) as a surgical intervention .  The patient's history has been reviewed, patient examined, no change in status, stable for surgery.  I have reviewed the patient's chart and labs.  Questions were answered to the patient's and husband's satisfaction.   Her LMP was late in December. [name]

## 2013-01-03 ENCOUNTER — Encounter (HOSPITAL_COMMUNITY): Payer: Self-pay | Admitting: Obstetrics and Gynecology

## 2014-05-18 ENCOUNTER — Other Ambulatory Visit: Payer: Self-pay | Admitting: Obstetrics and Gynecology

## 2014-06-04 ENCOUNTER — Other Ambulatory Visit: Payer: Self-pay | Admitting: *Deleted

## 2014-06-04 MED ORDER — POLYETHYLENE GLYCOL 3350 17 GM/SCOOP PO POWD
ORAL | Status: DC
Start: 2014-06-04 — End: 2018-03-27

## 2017-05-23 ENCOUNTER — Emergency Department (HOSPITAL_COMMUNITY): Payer: Medicaid Other

## 2017-05-23 ENCOUNTER — Encounter (HOSPITAL_COMMUNITY): Payer: Self-pay | Admitting: *Deleted

## 2017-05-23 ENCOUNTER — Emergency Department (HOSPITAL_COMMUNITY)
Admission: EM | Admit: 2017-05-23 | Discharge: 2017-05-24 | Disposition: A | Discharge status: Home / Self Care | Payer: Medicaid Other | Attending: Emergency Medicine | Admitting: Emergency Medicine

## 2017-05-23 DIAGNOSIS — F17220 Nicotine dependence, chewing tobacco, uncomplicated: Secondary | ICD-10-CM | POA: Insufficient documentation

## 2017-05-23 DIAGNOSIS — R1110 Vomiting, unspecified: Secondary | ICD-10-CM | POA: Diagnosis present

## 2017-05-23 DIAGNOSIS — N390 Urinary tract infection, site not specified: Secondary | ICD-10-CM | POA: Diagnosis not present

## 2017-05-23 DIAGNOSIS — R42 Dizziness and giddiness: Secondary | ICD-10-CM

## 2017-05-23 DIAGNOSIS — Z79899 Other long term (current) drug therapy: Secondary | ICD-10-CM | POA: Diagnosis not present

## 2017-05-23 DIAGNOSIS — R319 Hematuria, unspecified: Secondary | ICD-10-CM

## 2017-05-23 LAB — COMPREHENSIVE METABOLIC PANEL
ALT: 19 U/L (ref 14–54)
AST: 18 U/L (ref 15–41)
Albumin: 3.3 g/dL — ABNORMAL LOW (ref 3.5–5.0)
Alkaline Phosphatase: 192 U/L — ABNORMAL HIGH (ref 38–126)
Anion gap: 8 (ref 5–15)
BUN: 10 mg/dL (ref 6–20)
CHLORIDE: 105 mmol/L (ref 101–111)
CO2: 29 mmol/L (ref 22–32)
CREATININE: 0.66 mg/dL (ref 0.44–1.00)
Calcium: 9.1 mg/dL (ref 8.9–10.3)
GFR calc Af Amer: >60 mL/min (ref >60)
Glucose, Bld: 106 mg/dL — ABNORMAL HIGH (ref 65–99)
Potassium: 3.3 mmol/L — ABNORMAL LOW (ref 3.5–5.1)
Sodium: 142 mmol/L (ref 135–145)
Total Bilirubin: 0.2 mg/dL — ABNORMAL LOW (ref 0.3–1.2)
Total Protein: 7.6 g/dL (ref 6.5–8.1)

## 2017-05-23 LAB — CBC WITH DIFFERENTIAL/PLATELET
BASOS ABS: 0 10*3/uL (ref 0.0–0.1)
Basophils Relative: 0 %
EOS PCT: 2 %
Eosinophils Absolute: 0.2 10*3/uL (ref 0.0–0.7)
HCT: 40 % (ref 36.0–46.0)
Hemoglobin: 12.8 g/dL (ref 12.0–15.0)
LYMPHS PCT: 29 %
Lymphs Abs: 3.2 10*3/uL (ref 0.7–4.0)
MCH: 28.5 pg (ref 26.0–34.0)
MCHC: 32 g/dL (ref 30.0–36.0)
MCV: 89.1 fL (ref 78.0–100.0)
Monocytes Absolute: 0.5 10*3/uL (ref 0.1–1.0)
Monocytes Relative: 5 %
NEUTROS ABS: 6.9 10*3/uL (ref 1.7–7.7)
NEUTROS PCT: 64 %
PLATELETS: 349 10*3/uL (ref 150–400)
RBC: 4.49 MIL/uL (ref 3.87–5.11)
RDW: 13 % (ref 11.5–15.5)
WBC: 10.8 10*3/uL — AB (ref 4.0–10.5)

## 2017-05-23 MED ORDER — LORAZEPAM 2 MG/ML IJ SOLN
0.5000 mg | Freq: Once | INTRAMUSCULAR | Status: AC
  Administered 2017-05-23: 0.5 mg via INTRAVENOUS
  Filled 2017-05-23: qty 1

## 2017-05-23 NOTE — ED Triage Notes (Signed)
Caretaker state patient has been dizzy for the past 2 days, patient had a massive stroke 10 years ago and is somewhat unable to understand. Caretaker states she is not her norm

## 2017-05-23 NOTE — ED Provider Notes (Signed)
[facility]-EMERGENCY DEPT Provider Note   CSN: [id number] Arrival date & time: 05/23/17  2110     History   Chief Complaint Chief Complaint  Patient presents with  . Dizziness    HPI Sharon Stevenson is a 56 y.o. female.  The history is provided by the patient. No language interpreter was used.  Dizziness  Quality:  Unable to specify Severity:  Moderate Onset quality:  Gradual Duration:  2 days Timing:  Constant Progression:  Worsening Chronicity:  New Relieved by:  Nothing Worsened by:  Nothing Ineffective treatments:  None tried Associated symptoms: vomiting   Pt's caretaker reports pt had a stroke 10 years ago.  Pt has vomited twice over the past 2 days.  Pt has been more off balance than usual.  Pt is able to stand and transfer.    Past Medical History:  Diagnosis Date  . Menorrhagia with regular cycle 2014   Ablation endometrial, 01/02/2013  . Stroke [facility]) 2008    Patient Active Problem List   Diagnosis Date Noted  . Menorrhagia with regular cycle 01/02/2013    Past Surgical History:  Procedure Laterality Date  . CESAREAN SECTION    . DILITATION & CURRETTAGE/HYSTROSCOPY WITH THERMACHOICE ABLATION  01/02/2013   Procedure: DILATATION & CURETTAGE/HYSTEROSCOPY WITH THERMACHOICE ABLATION;  Surgeon: [name], MD;  Location: [facility] ORS;  Service: Gynecology;  Laterality: N/A;  Total Ablation Time = 12 minutes 36 seconds     ; 86-88 deg C  . TUBAL LIGATION      OB History    No data available       Home Medications    Prior to Admission medications   Medication Sig Start Date End Date Taking? Authorizing Provider  ALPRAZolam Prudy Feeler) 0.5 MG tablet Take 0.5 mg by mouth 3 (three) times daily.    Yes [provider]  clopidogrel (PLAVIX) 75 MG tablet Take 75 mg by mouth every morning.    Yes [provider]  cyproheptadine (PERIACTIN) 4 MG tablet Take 4 mg by mouth at bedtime.    Yes [provider]  Dextromethorphan-Quinidine (NUEDEXTA)  20-10 MG CAPS Take 1 capsule by mouth at bedtime.   Yes [provider]  DULoxetine (CYMBALTA) 60 MG capsule Take 60 mg by mouth at bedtime.    Yes [provider]  levETIRAcetam (KEPPRA) 500 MG tablet Take 500 mg by mouth 2 (two) times daily.   Yes [provider]  oxyCODONE-acetaminophen (PERCOCET/ROXICET) 5-325 MG per tablet Take 1 tablet by mouth every 4 (four) hours as needed for pain. Patient taking differently: Take 1 tablet by mouth 2 (two) times daily as needed for moderate pain or severe pain.  01/02/13  Yes [name], MD  phenytoin (DILANTIN) 50 MG tablet Chew 150 mg by mouth 2 (two) times daily.    Yes [provider]  polyethylene glycol powder (GLYCOLAX/MIRALAX) powder TAKE 17 GRAMS (1 CAPFUL) IN 8 OZ OF FLUID ONCE DAILY 06/04/14  Yes Cheral Marker, CNM  QUEtiapine (SEROQUEL) 400 MG tablet Take 400 mg by mouth at bedtime.   Yes [provider]  simvastatin (ZOCOR) 20 MG tablet Take 20 mg by mouth every morning.    Yes [provider]    Family History No family history on file.  Social History Social History  Substance Use Topics  . Smoking status: Former Games developer  . Smokeless tobacco: Current User    Types: Chew  . Alcohol use No     Allergies  Patient has no known allergies.   Review of Systems Review of Systems  Gastrointestinal: Positive for vomiting.  Neurological: Positive for dizziness.  All other systems reviewed and are negative.    Physical Exam Updated Vital Signs BP 138/87   Pulse 73   Temp 98.3 F (36.8 C) (Oral)   Resp 20   Ht 6' (1.829 m)   Wt 127 kg (280 lb)   SpO2 99%   BMI 37.97 kg/m   Physical Exam  Constitutional: She appears well-developed and well-nourished.  HENT:  Head: Normocephalic and atraumatic.  Right Ear: External ear normal.  Left Ear: External ear normal.  Nose: Nose normal.  Mouth/Throat: Oropharynx is clear and moist.  Eyes:  Left pupil deviates  upward and outward, no response. (normal per caregiver)    Neck: Normal range of motion.  Cardiovascular: Normal rate and regular rhythm.   Pulmonary/Chest: Effort normal.  Abdominal: Soft.  Musculoskeletal: Normal range of motion.  Neurological: She is alert. Coordination abnormal.  Poor coordination,    Skin: Skin is warm.     ED Treatments / Results  Labs (all labs ordered are listed, but only abnormal results are displayed) Labs Reviewed  CBC WITH DIFFERENTIAL/PLATELET  COMPREHENSIVE METABOLIC PANEL  URINALYSIS, ROUTINE W REFLEX MICROSCOPIC    EKG  EKG Interpretation None       Radiology No results found.  Procedures Procedures (including critical care time)  Medications Ordered in ED Medications - No data to display   Initial Impression / Assessment and Plan / ED Course  I have reviewed the triage vital signs and the nursing notes.  Pertinent labs & imaging results that were available during my care of the patient were reviewed by me and considered in my medical decision making (see chart for details).   Pt given Iv fluids and oral keflex   No new deficit when discussed with family  Final Clinical Impressions(s) / ED Diagnoses   Final diagnoses:  Urinary tract infection with hematuria, site unspecified  Dizziness    New Prescriptions New Prescriptions   CEPHALEXIN (KEFLEX) 500 MG CAPSULE    Take 1 capsule (500 mg total) by mouth 4 (four) times daily.     Elson AreasSofia, Deshan Hemmelgarn K, PA-C 05/24/17 0101    Bethann BerkshireZammit, Joseph, MD 05/25/17 [phone]

## 2017-05-24 LAB — URINALYSIS, ROUTINE W REFLEX MICROSCOPIC
Glucose, UA: NEGATIVE mg/dL
Ketones, ur: NEGATIVE mg/dL
NITRITE: POSITIVE — AB
PROTEIN: 30 mg/dL — AB
SPECIFIC GRAVITY, URINE: 1.025 (ref 1.005–1.030)
pH: 5 (ref 5.0–8.0)

## 2017-05-24 MED ORDER — SODIUM CHLORIDE 0.9 % IV SOLN
Freq: Once | INTRAVENOUS | Status: AC
  Administered 2017-05-24: 01:00:00 via INTRAVENOUS

## 2017-05-24 MED ORDER — CEPHALEXIN 500 MG PO CAPS
500.0000 mg | ORAL_CAPSULE | Freq: Once | ORAL | Status: AC
  Administered 2017-05-24: 500 mg via ORAL
  Filled 2017-05-24: qty 1

## 2017-05-24 MED ORDER — CEPHALEXIN 500 MG PO CAPS
500.0000 mg | ORAL_CAPSULE | Freq: Four times a day (QID) | ORAL | 0 refills | Status: DC
Start: 2017-05-24 — End: 2018-03-27

## 2017-05-24 NOTE — ED Notes (Signed)
ED Provider at bedside. 

## 2017-05-24 NOTE — ED Notes (Signed)
Pt remains in radiology at this time.

## 2017-05-24 NOTE — Discharge Instructions (Signed)
See your Physician for recheck.  Urine culture is pending

## 2017-05-25 LAB — URINE CULTURE

## 2017-12-26 IMAGING — CT CT HEAD W/O CM
3 series · 15 of 47 positions shown, 18 images · non-contrast
Comparison: Remote head CT 04/26/2007

CLINICAL DATA: Dizziness.

EXAM:
CT HEAD WITHOUT CONTRAST
TECHNIQUE: Contiguous axial images were obtained from the base of the skull
through the vertex without intravenous contrast.

[Series 2: head wo · axial · 0.47mm/px · z∈[+1422,+1547]mm · 9 of 31 slices shown, 12 images]
[im 3/31  brain]
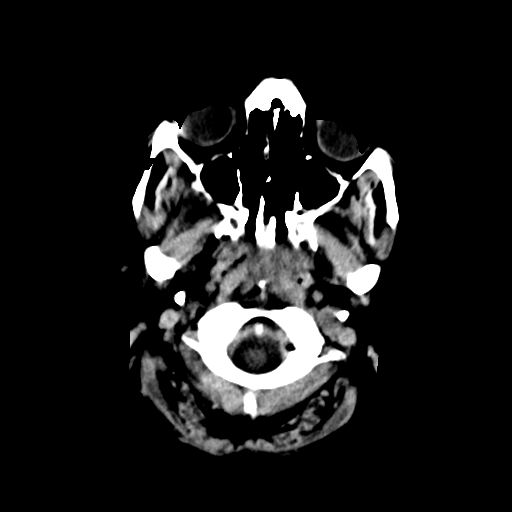
[im 3/31  bone]
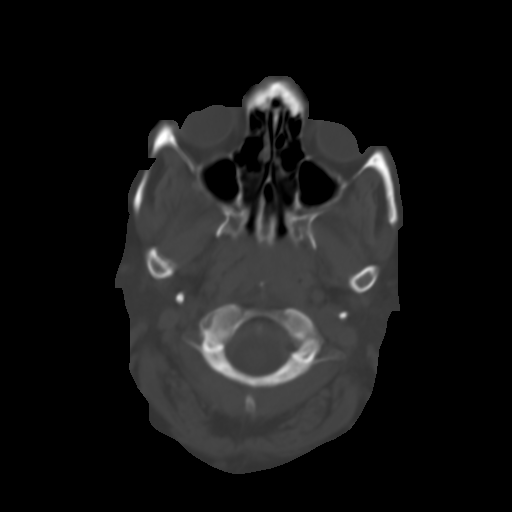
[im 6/31  brain]
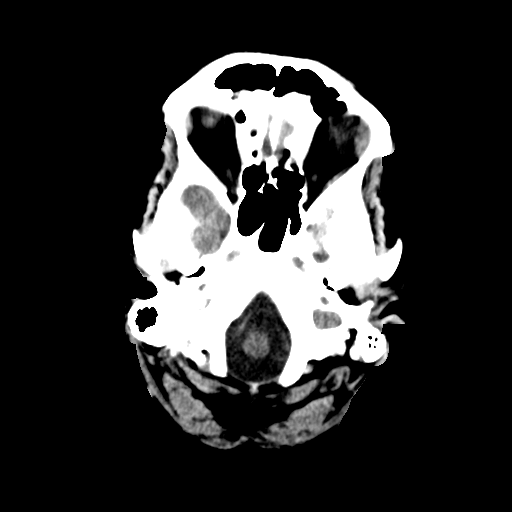
[im 9/31  brain]
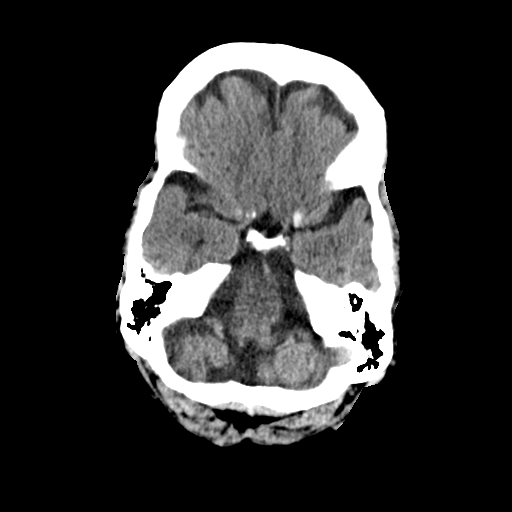
[im 12/31  brain]
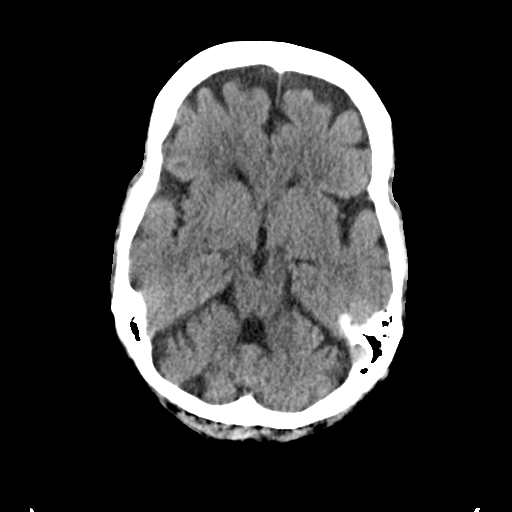
[im 16/31  brain]
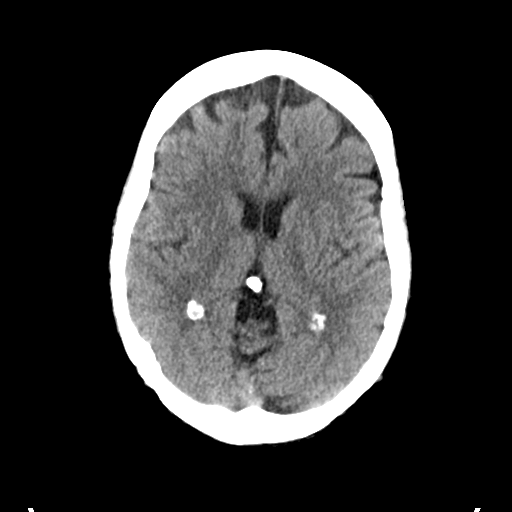
[im 16/31  bone]
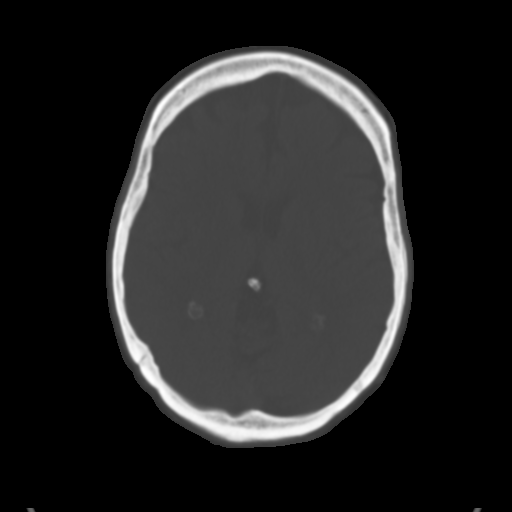
[im 19/31  brain]
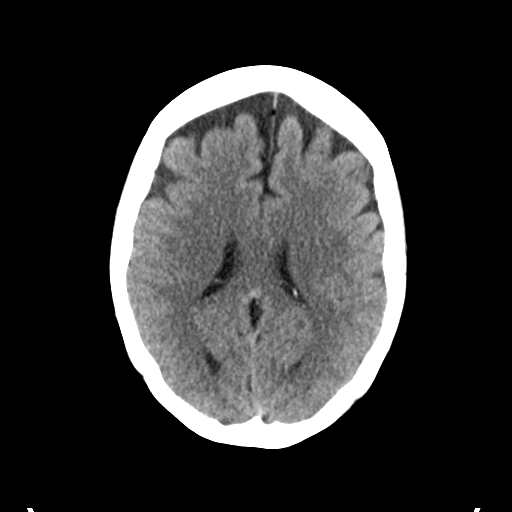
[im 22/31  brain]
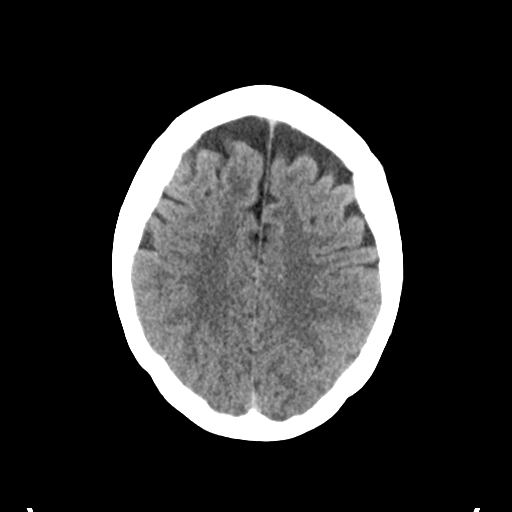
[im 25/31  brain]
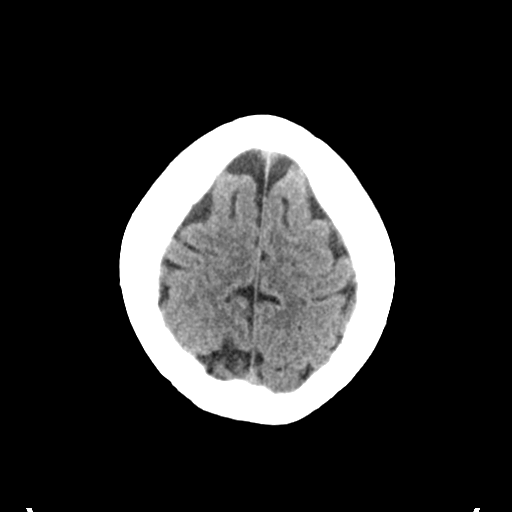
[im 28/31  brain]
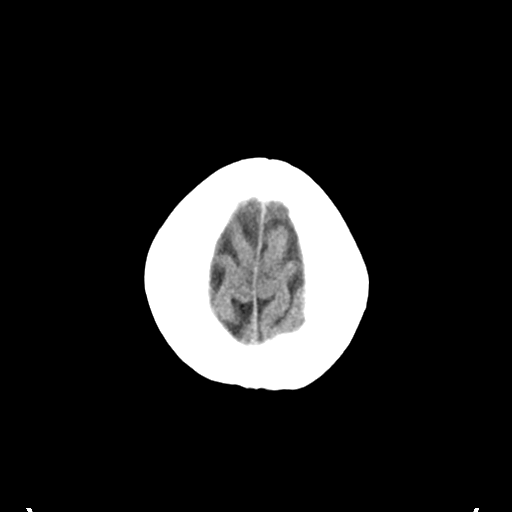
[im 28/31  bone]
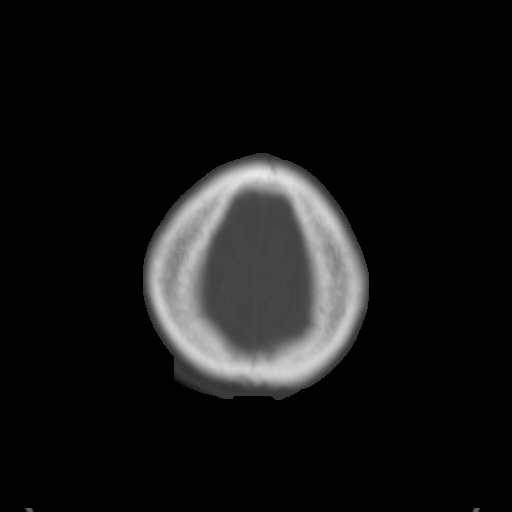

[Series 4: coronal soft tissue · coronal · 0.38mm/px · 3 of 72 slices shown]
[im 24/72  brain]
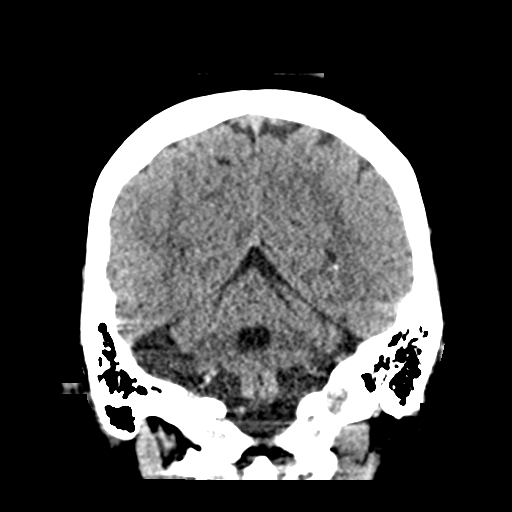
[im 32/72  brain]
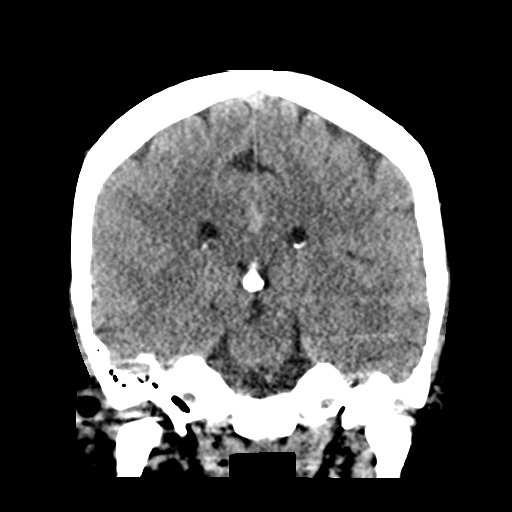
[im 40/72  brain]
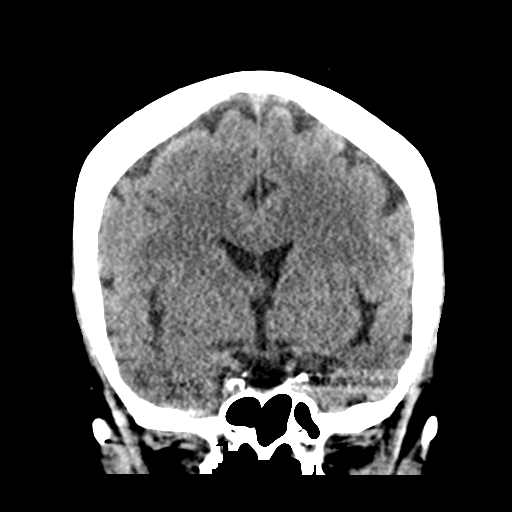

[Series 5: sagittal soft tissue · sagittal · 0.35mm/px · 3 of 55 slices shown]
[im 19/55  brain]
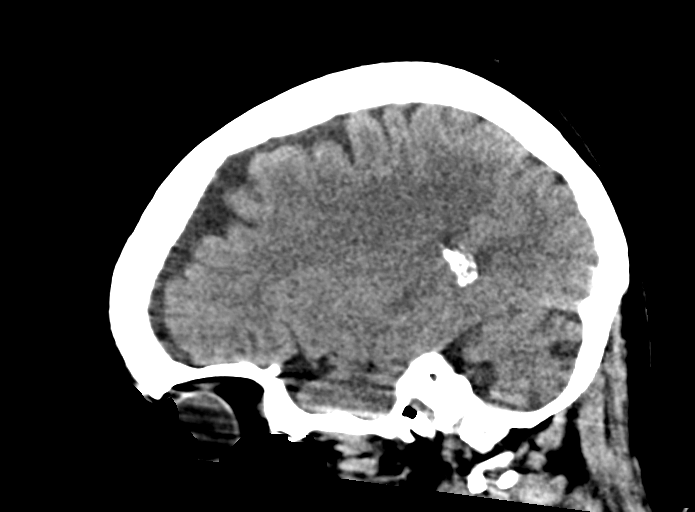
[im 28/55  brain]
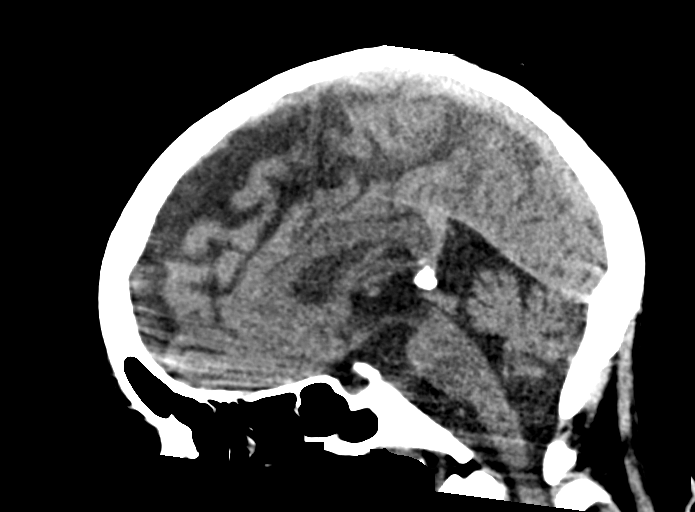
[im 37/55  brain]
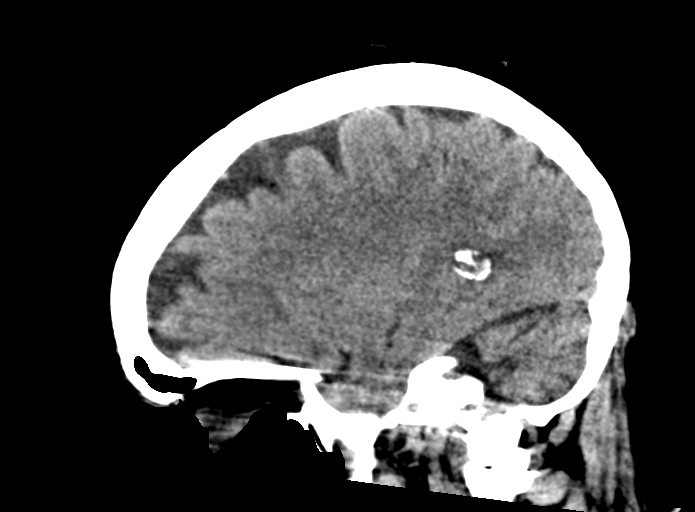

[15 of 47 positions shown; findings below may reference images not displayed]

FINDINGS: Brain: Sequela of posterior circulation infarcts with
encephalomalacia in the right greater than left cerebellum and
midbrain. Remote bilateral splenic infarcts. No hemorrhage or
evidence of acute ischemia. Progressive volume loss from prior exam.
No extra-axial or subdural fluid collection. No mass effect or
midline shift.

Vascular: Atherosclerosis of skullbase vasculature without
hyperdense vessel or abnormal calcification.

Skull: No fracture or focal lesion.

Sinuses/Orbits: Paranasal sinuses and mastoid air cells are clear.
The visualized orbits are unremarkable.

Other: None.
IMPRESSION: 1.  No acute intracranial abnormality.
2. Sequela of remote posterior territory infarcts with
encephalomalacia, most prominent in the right cerebellum.
3. Progressive cerebral atrophy over the last 10 years.

## 2018-01-12 ENCOUNTER — Emergency Department (HOSPITAL_COMMUNITY)
Admission: EM | Admit: 2018-01-12 | Discharge: 2018-01-12 | Disposition: A | Discharge status: Home / Self Care | Payer: Medicaid Other | Attending: Emergency Medicine | Admitting: Emergency Medicine

## 2018-01-12 ENCOUNTER — Emergency Department (HOSPITAL_COMMUNITY): Payer: Medicaid Other

## 2018-01-12 ENCOUNTER — Encounter (HOSPITAL_COMMUNITY): Payer: Self-pay

## 2018-01-12 ENCOUNTER — Other Ambulatory Visit: Payer: Self-pay

## 2018-01-12 DIAGNOSIS — Z79899 Other long term (current) drug therapy: Secondary | ICD-10-CM | POA: Insufficient documentation

## 2018-01-12 DIAGNOSIS — Z7902 Long term (current) use of antithrombotics/antiplatelets: Secondary | ICD-10-CM | POA: Insufficient documentation

## 2018-01-12 DIAGNOSIS — Z8673 Personal history of transient ischemic attack (TIA), and cerebral infarction without residual deficits: Secondary | ICD-10-CM | POA: Insufficient documentation

## 2018-01-12 DIAGNOSIS — J181 Lobar pneumonia, unspecified organism: Secondary | ICD-10-CM | POA: Diagnosis not present

## 2018-01-12 DIAGNOSIS — J189 Pneumonia, unspecified organism: Secondary | ICD-10-CM

## 2018-01-12 DIAGNOSIS — R410 Disorientation, unspecified: Secondary | ICD-10-CM | POA: Diagnosis present

## 2018-01-12 DIAGNOSIS — Z87891 Personal history of nicotine dependence: Secondary | ICD-10-CM | POA: Insufficient documentation

## 2018-01-12 LAB — CBC
HCT: 39.3 % (ref 36.0–46.0)
HEMOGLOBIN: 12.4 g/dL (ref 12.0–15.0)
MCH: 28.1 pg (ref 26.0–34.0)
MCHC: 31.6 g/dL (ref 30.0–36.0)
MCV: 88.9 fL (ref 78.0–100.0)
Platelets: 280 10*3/uL (ref 150–400)
RBC: 4.42 MIL/uL (ref 3.87–5.11)
RDW: 13 % (ref 11.5–15.5)
WBC: 14.4 10*3/uL — ABNORMAL HIGH (ref 4.0–10.5)

## 2018-01-12 LAB — COMPREHENSIVE METABOLIC PANEL
ALT: 27 U/L (ref 14–54)
ANION GAP: 14 (ref 5–15)
AST: 20 U/L (ref 15–41)
Albumin: 2.9 g/dL — ABNORMAL LOW (ref 3.5–5.0)
Alkaline Phosphatase: 189 U/L — ABNORMAL HIGH (ref 38–126)
BUN: 15 mg/dL (ref 6–20)
CHLORIDE: 98 mmol/L — AB (ref 101–111)
CO2: 20 mmol/L — AB (ref 22–32)
Calcium: 8.4 mg/dL — ABNORMAL LOW (ref 8.9–10.3)
Creatinine, Ser: 0.69 mg/dL (ref 0.44–1.00)
GFR calc Af Amer: >60 mL/min (ref >60)
GFR calc non Af Amer: >60 mL/min (ref >60)
Glucose, Bld: 137 mg/dL — ABNORMAL HIGH (ref 65–99)
POTASSIUM: 3.4 mmol/L — AB (ref 3.5–5.1)
SODIUM: 132 mmol/L — AB (ref 135–145)
Total Bilirubin: 1.2 mg/dL (ref 0.3–1.2)
Total Protein: 8.2 g/dL — ABNORMAL HIGH (ref 6.5–8.1)

## 2018-01-12 LAB — I-STAT CG4 LACTIC ACID, ED: Lactic Acid, Venous: 0.81 mmol/L (ref 0.5–1.9)

## 2018-01-12 MED ORDER — AZITHROMYCIN 250 MG PO TABS
500.0000 mg | ORAL_TABLET | Freq: Once | ORAL | Status: AC
  Administered 2018-01-12: 500 mg via ORAL
  Filled 2018-01-12: qty 2

## 2018-01-12 MED ORDER — CEFTRIAXONE SODIUM 1 G IJ SOLR
1.0000 g | Freq: Once | INTRAMUSCULAR | Status: AC
  Administered 2018-01-12: 1 g via INTRAVENOUS
  Filled 2018-01-12: qty 10

## 2018-01-12 MED ORDER — AMOXICILLIN-POT CLAVULANATE 875-125 MG PO TABS: 1.0000 | ORAL_TABLET | Freq: Two times a day (BID) | ORAL | 0 refills | Status: DC

## 2018-01-12 MED ORDER — SODIUM CHLORIDE 0.9 % IV BOLUS (SEPSIS)
500.0000 mL | Freq: Once | INTRAVENOUS | Status: AC
  Administered 2018-01-12: 500 mL via INTRAVENOUS

## 2018-01-12 NOTE — ED Notes (Signed)
Applied 2 LPM of oxygen to patient via [place].

## 2018-01-12 NOTE — ED Triage Notes (Signed)
Family brings patient to ED for increased confusion since yesterday. Patient has hx of UTI per family and often has confusion. Patient has odor noted to urine. Patient complains of generalized weakness. Denies pain.

## 2018-01-12 NOTE — ED Provider Notes (Signed)
[facility] EMERGENCY DEPARTMENT Provider Note   CSN: [id number] Arrival date & time: 01/12/18  1252     History   Chief Complaint Chief Complaint  Patient presents with  . Altered Mental Status    HPI Sharon STANISLAWSKI is a 57 y.o. female.  She presents for evaluation of cough and confusion.  Symptoms ongoing for several months.  Cough has progressed in the last 24 hours with confusion that is typical of her usual side effect when she has urinary tract infection.  The patient is unable to contribute to history, but is aware and alert.  Her daughter is with her and gives history.  The patient has been eating well.  She has not had a fever.  She occasionally coughs up green mucus.  The patient is essentially bedbound because of a severe stroke in the past.  The patient is able to eat, but cannot feed herself.  Level 5 caveat- aphasia.  HPI  Past Medical History:  Diagnosis Date  . Menorrhagia with regular cycle 2014   Ablation endometrial, 01/02/2013  . Stroke [facility]) 2008    Patient Active Problem List   Diagnosis Date Noted  . Menorrhagia with regular cycle 01/02/2013    Past Surgical History:  Procedure Laterality Date  . CESAREAN SECTION    . DILITATION & CURRETTAGE/HYSTROSCOPY WITH THERMACHOICE ABLATION  01/02/2013   Procedure: DILATATION & CURETTAGE/HYSTEROSCOPY WITH THERMACHOICE ABLATION;  Surgeon: [name], MD;  Location: [facility] ORS;  Service: Gynecology;  Laterality: N/A;  Total Ablation Time = 12 minutes 36 seconds     ; 86-88 deg C  . TUBAL LIGATION      OB History    No data available       Home Medications    Prior to Admission medications   Medication Sig Start Date End Date Taking? Authorizing Provider  ALPRAZolam Prudy Feeler) 0.5 MG tablet Take 0.5 mg by mouth 3 (three) times daily.    Yes [provider]  amoxicillin-clavulanate (AUGMENTIN) 875-125 MG tablet Take 1 tablet by mouth 2 (two) times daily. One po bid x 7 days 01/12/18   Mancel Bale, MD   cephALEXin (KEFLEX) 500 MG capsule Take 1 capsule (500 mg total) by mouth 4 (four) times daily. 05/24/17   Elson Areas, PA-C  clopidogrel (PLAVIX) 75 MG tablet Take 75 mg by mouth every morning.     [provider]  cyproheptadine (PERIACTIN) 4 MG tablet Take 4 mg by mouth at bedtime.     [provider]  Dextromethorphan-Quinidine (NUEDEXTA) 20-10 MG CAPS Take 1 capsule by mouth at bedtime.    [provider]  DULoxetine (CYMBALTA) 60 MG capsule Take 60 mg by mouth at bedtime.     [provider]  HYDROcodone-acetaminophen (NORCO/VICODIN) 5-325 MG tablet Take 1 tablet by mouth 3 (three) times daily. 12/27/17   [provider]  lactulose, encephalopathy, (CHRONULAC) 10 GM/15ML SOLN Take 30 mLs by mouth daily as needed. 10/13/17   [provider]  levETIRAcetam (KEPPRA) 500 MG tablet Take 500 mg by mouth 2 (two) times daily.    [provider]  MOVANTIK 25 MG TABS tablet Take 25 mg by mouth daily. 11/15/17   [provider]  oxyCODONE-acetaminophen (PERCOCET/ROXICET) 5-325 MG per tablet Take 1 tablet by mouth every 4 (four) hours as needed for pain. Patient taking differently: Take 1 tablet by mouth 2 (two) times daily as needed for moderate pain or severe pain.  01/02/13   Christin Bach  V, MD  pantoprazole (PROTONIX) 40 MG tablet Take 40 mg by mouth daily. 12/08/17   [provider]  phenytoin (DILANTIN) 50 MG tablet Chew 150 mg by mouth 2 (two) times daily.     [provider]  polyethylene glycol powder (GLYCOLAX/MIRALAX) powder TAKE 17 GRAMS (1 CAPFUL) IN 8 OZ OF FLUID ONCE DAILY 06/04/14   Cheral MarkerBooker, Kimberly R, CNM  QUEtiapine (SEROQUEL) 400 MG tablet Take 400 mg by mouth at bedtime.    [provider]  risperiDONE (RISPERDAL) 0.25 MG tablet Take 0.25 mg by mouth at bedtime. 12/15/17   [provider]  simvastatin (ZOCOR) 20 MG tablet Take 20 mg by mouth every morning.     [provider]    Family History No family history on file.  Social History Social History   Tobacco Use  . Smoking status: Former Games developermoker  . Smokeless tobacco: Current User    Types: Chew  Substance Use Topics  . Alcohol use: No  . Drug use: No     Allergies   Patient has no known allergies.   Review of Systems Review of Systems  Unable to perform ROS: Other     Physical Exam Updated Vital Signs BP 99/68   Pulse 91   Temp 99.9 F (37.7 C) (Oral)   Resp 18   Ht 6' (1.829 m)   Wt 122.5 kg (270 lb)   SpO2 95%   BMI 36.62 kg/m   Physical Exam  Constitutional: She appears well-developed.  Morbidly obese  HENT:  Head: Normocephalic and atraumatic.  Right Ear: External ear normal.  Left Ear: External ear normal.  Eyes: Conjunctivae and EOM are normal.  Disconjugate gaze, left pupil 8 mm nonreactive.  Right pupil 3 mm reactive.  Neck: Normal range of motion and phonation normal. Neck supple.  Cardiovascular: Normal rate and regular rhythm.  Pulmonary/Chest: Effort normal. No respiratory distress. She exhibits no tenderness.  Scattered rhonchi.  No increased work of breathing.  Abdominal: Soft. She exhibits no distension. There is no tenderness. There is no guarding.  Musculoskeletal: Normal range of motion. She exhibits edema (1+ pretibial edema bilaterally).  Neurological: She is alert.  Responsive, and cooperative with exam.  Dysarthria is present.  Skin: Skin is warm and dry.  Psychiatric: She has a normal mood and affect. Her behavior is normal.  Nursing note and vitals reviewed.    ED Treatments / Results  Labs (all labs ordered are listed, but only abnormal results are displayed) Labs Reviewed  COMPREHENSIVE METABOLIC PANEL - Abnormal; Notable for the following components:      Result Value   Sodium 132 (*)    Potassium 3.4 (*)    Chloride 98 (*)    CO2 20 (*)    Glucose, Bld 137 (*)    Calcium 8.4 (*)    Total Protein 8.2 (*)    Albumin 2.9  (*)    Alkaline Phosphatase 189 (*)    All other components within normal limits  CBC - Abnormal; Notable for the following components:   WBC 14.4 (*)    All other components within normal limits  CULTURE, BLOOD (ROUTINE X 2)  CULTURE, BLOOD (ROUTINE X 2)  I-STAT CG4 LACTIC ACID, ED  I-STAT CG4 LACTIC ACID, ED    EKG  EKG Interpretation None       Radiology Dg Chest 2 View  Result Date: 01/12/2018 CLINICAL DATA:  Decreased oxygen saturation.  Cough. EXAM: CHEST  2 VIEW COMPARISON:  May 23, 2017 FINDINGS: There is airspace consolidation in the left base consistent with pneumonia. Lungs elsewhere are clear. Heart size and pulmonary vascularity are normal. No adenopathy. No bone lesions. IMPRESSION: Posterior left base airspace consolidation consistent with pneumonia. Lungs elsewhere clear. No adenopathy evident. Followup PA and lateral chest radiographs recommended in 3-4 weeks following trial of antibiotic therapy to ensure resolution and exclude underlying malignancy. Electronically Signed   By: Bretta Bang III M.D.   On: 01/12/2018 13:52    Procedures Procedures (including critical care time)  Medications Ordered in ED Medications  sodium chloride 0.9 % bolus 500 mL (0 mLs Intravenous Stopped 01/12/18 1717)  cefTRIAXone (ROCEPHIN) 1 g in dextrose 5 % 50 mL IVPB (0 g Intravenous Stopped 01/12/18 1717)  azithromycin (ZITHROMAX) tablet 500 mg (500 mg Oral Given 01/12/18 1559)     Initial Impression / Assessment and Plan / ED Course  I have reviewed the triage vital signs and the nursing notes.  Pertinent labs & imaging results that were available during my care of the patient were reviewed by me and considered in my medical decision making (see chart for details).     Medical screening examination/treatment/procedure(s) were performed by non-physician practitioner and as supervising physician I was immediately available for consultation/collaboration.   EKG  Interpretation None       Patient Vitals for the past 24 hrs:  BP Temp Temp src Pulse Resp SpO2 Height Weight  01/12/18 1900 99/68 - - - - - - -  01/12/18 1700 102/61 - - 91 - 95 % - -  01/12/18 1630 107/66 - - 93 - 97 % - -  01/12/18 1606 - - - 95 - 96 % - -  01/12/18 1319 102/78 99.9 F (37.7 C) Oral (!) 110 18 90 % 6' (1.829 m) 122.5 kg (270 lb)    At discharge reevaluation with update and discussion. After initial assessment and treatment, an updated evaluation reveals no further complaints she remains alert, nontoxic and comfortable.Mancel Bale     Final Clinical Impressions(s) / ED Diagnoses   Final diagnoses:  Community acquired pneumonia of left lower lobe of lung (HCC)   Pneumonia versus bronchitis, viral versus bacterial.  Patient is stable.  No indication for severe sepsis, metabolic instability or impending vascular collapse.  She is stable for discharge without further intervention or treatment.  Nursing Notes Reviewed/ Care Coordinated Applicable Imaging Reviewed Interpretation of Laboratory Data incorporated into ED treatment  The patient appears reasonably screened and/or stabilized for discharge and I doubt any other medical condition or other [facility] requiring further screening, evaluation, or treatment in the ED at this time prior to discharge.  Plan: Home Medications-continue current home medications, [facility] for fever; Home Treatments-rest, push oral fluids; return here if the recommended treatment, does not improve the symptoms; Recommended follow up-PCP checkup in 1 week.   ED Discharge Orders        Ordered    amoxicillin-clavulanate (AUGMENTIN) 875-125 MG tablet  2 times daily     01/12/18 1734       Mancel Bale, MD 01/13/18 [phone]

## 2018-01-12 NOTE — Discharge Instructions (Signed)
Start the antibiotic prescription tomorrow morning.  Try to encourage her to drink plenty of fluids.  If she develops a fever, use Tylenol 650 mg every 4 hours.  Return here or see your doctor as needed for problems.

## 2018-01-17 LAB — CULTURE, BLOOD (ROUTINE X 2)
CULTURE: NO GROWTH
CULTURE: NO GROWTH
Special Requests: ADEQUATE

## 2018-03-27 ENCOUNTER — Encounter (HOSPITAL_COMMUNITY): Payer: Self-pay | Admitting: *Deleted

## 2018-03-27 ENCOUNTER — Other Ambulatory Visit: Payer: Self-pay

## 2018-03-27 ENCOUNTER — Observation Stay (HOSPITAL_COMMUNITY)
Admission: EM | Admit: 2018-03-27 | Discharge: 2018-03-30 | Disposition: A | Discharge status: Home / Self Care | Payer: Medicaid Other | Attending: Family Medicine | Admitting: Family Medicine

## 2018-03-27 ENCOUNTER — Observation Stay (HOSPITAL_COMMUNITY): Payer: Medicaid Other

## 2018-03-27 DIAGNOSIS — E860 Dehydration: Secondary | ICD-10-CM | POA: Insufficient documentation

## 2018-03-27 DIAGNOSIS — Z7901 Long term (current) use of anticoagulants: Secondary | ICD-10-CM | POA: Diagnosis not present

## 2018-03-27 DIAGNOSIS — Z87891 Personal history of nicotine dependence: Secondary | ICD-10-CM | POA: Insufficient documentation

## 2018-03-27 DIAGNOSIS — R410 Disorientation, unspecified: Secondary | ICD-10-CM | POA: Diagnosis present

## 2018-03-27 DIAGNOSIS — R569 Unspecified convulsions: Secondary | ICD-10-CM | POA: Diagnosis not present

## 2018-03-27 DIAGNOSIS — R1115 Cyclical vomiting syndrome unrelated to migraine: Secondary | ICD-10-CM

## 2018-03-27 DIAGNOSIS — Z8673 Personal history of transient ischemic attack (TIA), and cerebral infarction without residual deficits: Secondary | ICD-10-CM | POA: Insufficient documentation

## 2018-03-27 DIAGNOSIS — R1110 Vomiting, unspecified: Secondary | ICD-10-CM

## 2018-03-27 DIAGNOSIS — Z79899 Other long term (current) drug therapy: Secondary | ICD-10-CM | POA: Diagnosis not present

## 2018-03-27 DIAGNOSIS — R112 Nausea with vomiting, unspecified: Principal | ICD-10-CM | POA: Insufficient documentation

## 2018-03-27 HISTORY — DX: Unspecified convulsions: R56.9

## 2018-03-27 LAB — COMPREHENSIVE METABOLIC PANEL
ALT: 31 U/L (ref 14–54)
ANION GAP: 15 (ref 5–15)
AST: 31 U/L (ref 15–41)
Albumin: 4.1 g/dL (ref 3.5–5.0)
Alkaline Phosphatase: 188 U/L — ABNORMAL HIGH (ref 38–126)
BUN: 12 mg/dL (ref 6–20)
CHLORIDE: 100 mmol/L — AB (ref 101–111)
CO2: 28 mmol/L (ref 22–32)
Calcium: 9.8 mg/dL (ref 8.9–10.3)
Creatinine, Ser: 0.72 mg/dL (ref 0.44–1.00)
GFR calc non Af Amer: >60 mL/min (ref >60)
Glucose, Bld: 122 mg/dL — ABNORMAL HIGH (ref 65–99)
POTASSIUM: 3.3 mmol/L — AB (ref 3.5–5.1)
Sodium: 143 mmol/L (ref 135–145)
Total Bilirubin: 0.6 mg/dL (ref 0.3–1.2)
Total Protein: 9.4 g/dL — ABNORMAL HIGH (ref 6.5–8.1)

## 2018-03-27 LAB — URINALYSIS, ROUTINE W REFLEX MICROSCOPIC
Bilirubin Urine: NEGATIVE
Glucose, UA: NEGATIVE mg/dL
Ketones, ur: 15 mg/dL — AB
NITRITE: POSITIVE — AB
Protein, ur: 100 mg/dL — AB
pH: 6 (ref 5.0–8.0)

## 2018-03-27 LAB — CBC
HEMATOCRIT: 47.9 % — AB (ref 36.0–46.0)
HEMOGLOBIN: 15 g/dL (ref 12.0–15.0)
MCH: 28.6 pg (ref 26.0–34.0)
MCHC: 31.3 g/dL (ref 30.0–36.0)
MCV: 91.4 fL (ref 78.0–100.0)
Platelets: 514 10*3/uL — ABNORMAL HIGH (ref 150–400)
RBC: 5.24 MIL/uL — AB (ref 3.87–5.11)
RDW: 13.3 % (ref 11.5–15.5)
WBC: 10.8 10*3/uL — ABNORMAL HIGH (ref 4.0–10.5)

## 2018-03-27 LAB — URINALYSIS, MICROSCOPIC (REFLEX)

## 2018-03-27 LAB — PHENYTOIN LEVEL, TOTAL: PHENYTOIN LVL: 2.5 ug/mL — AB (ref 10.0–20.0)

## 2018-03-27 LAB — HCG, SERUM, QUALITATIVE: PREG SERUM: POSITIVE — AB

## 2018-03-27 LAB — LIPASE, BLOOD: Lipase: 22 U/L (ref 11–51)

## 2018-03-27 MED ORDER — SIMVASTATIN 20 MG PO TABS
20.0000 mg | ORAL_TABLET | Freq: Every day | ORAL | Status: DC
  Administered 2018-03-28 – 2018-03-29 (×2): 20 mg via ORAL
  Filled 2018-03-27 (×2): qty 1

## 2018-03-27 MED ORDER — ONDANSETRON HCL 4 MG/2ML IJ SOLN
4.0000 mg | Freq: Once | INTRAMUSCULAR | Status: AC
  Administered 2018-03-27: 4 mg via INTRAVENOUS
  Filled 2018-03-27: qty 2

## 2018-03-27 MED ORDER — SODIUM CHLORIDE 0.9 % IV SOLN: 1.0000 g | INTRAVENOUS | Status: DC

## 2018-03-27 MED ORDER — QUETIAPINE FUMARATE 100 MG PO TABS
400.0000 mg | ORAL_TABLET | Freq: Every day | ORAL | Status: DC
  Administered 2018-03-28 – 2018-03-29 (×2): 400 mg via ORAL
  Filled 2018-03-27 (×3): qty 4

## 2018-03-27 MED ORDER — CLOPIDOGREL BISULFATE 75 MG PO TABS
75.0000 mg | ORAL_TABLET | Freq: Every morning | ORAL | Status: DC
  Administered 2018-03-28 – 2018-03-30 (×3): 75 mg via ORAL
  Filled 2018-03-27 (×4): qty 1

## 2018-03-27 MED ORDER — HYDROCODONE-ACETAMINOPHEN 5-325 MG PO TABS: 1.0000 | ORAL_TABLET | Freq: Three times a day (TID) | ORAL | Status: DC

## 2018-03-27 MED ORDER — LEVETIRACETAM 500 MG PO TABS
500.0000 mg | ORAL_TABLET | Freq: Two times a day (BID) | ORAL | Status: DC
  Administered 2018-03-27 – 2018-03-30 (×6): 500 mg via ORAL
  Filled 2018-03-27 (×6): qty 1

## 2018-03-27 MED ORDER — ONDANSETRON HCL 4 MG/2ML IJ SOLN: 4.0000 mg | Freq: Four times a day (QID) | INTRAMUSCULAR | Status: DC | PRN

## 2018-03-27 MED ORDER — ACETAMINOPHEN 650 MG RE SUPP: 650.0000 mg | Freq: Four times a day (QID) | RECTAL | Status: DC | PRN

## 2018-03-27 MED ORDER — DEXTROMETHORPHAN-QUINIDINE 20-10 MG PO CAPS
1.0000 | ORAL_CAPSULE | Freq: Every day | ORAL | Status: DC
  Filled 2018-03-27 (×4): qty 1

## 2018-03-27 MED ORDER — SODIUM CHLORIDE 0.9 % IV SOLN: 250.0000 mL | INTRAVENOUS | Status: DC | PRN

## 2018-03-27 MED ORDER — POTASSIUM CHLORIDE IN NACL 20-0.9 MEQ/L-% IV SOLN
INTRAVENOUS | Status: DC
  Administered 2018-03-27 – 2018-03-30 (×7): via INTRAVENOUS
  Filled 2018-03-27: qty 1000

## 2018-03-27 MED ORDER — SODIUM CHLORIDE 0.9 % IV SOLN
1.0000 g | Freq: Once | INTRAVENOUS | Status: DC
  Filled 2018-03-27: qty 10

## 2018-03-27 MED ORDER — POLYETHYLENE GLYCOL 3350 17 G PO PACK: 17.0000 g | PACK | Freq: Every day | ORAL | Status: DC | PRN

## 2018-03-27 MED ORDER — SODIUM CHLORIDE 0.9 % IV SOLN
500.0000 mg | Freq: Once | INTRAVENOUS | Status: AC
  Administered 2018-03-27: 500 mg via INTRAVENOUS
  Filled 2018-03-27: qty 10

## 2018-03-27 MED ORDER — PANTOPRAZOLE SODIUM 40 MG PO TBEC
40.0000 mg | DELAYED_RELEASE_TABLET | Freq: Every day | ORAL | Status: DC
  Administered 2018-03-28 – 2018-03-30 (×3): 40 mg via ORAL
  Filled 2018-03-27 (×3): qty 1

## 2018-03-27 MED ORDER — ONDANSETRON HCL 4 MG PO TABS: 4.0000 mg | ORAL_TABLET | Freq: Four times a day (QID) | ORAL | Status: DC | PRN

## 2018-03-27 MED ORDER — SODIUM CHLORIDE 0.9% FLUSH
3.0000 mL | Freq: Two times a day (BID) | INTRAVENOUS | Status: DC
  Administered 2018-03-28 – 2018-03-30 (×2): 3 mL via INTRAVENOUS

## 2018-03-27 MED ORDER — LACTULOSE 10 GM/15ML PO SOLN
20.0000 g | Freq: Every day | ORAL | Status: DC
  Administered 2018-03-28 – 2018-03-30 (×3): 20 g via ORAL
  Filled 2018-03-27 (×3): qty 30

## 2018-03-27 MED ORDER — PHENYTOIN 50 MG PO CHEW
150.0000 mg | CHEWABLE_TABLET | Freq: Two times a day (BID) | ORAL | Status: DC
  Administered 2018-03-27 – 2018-03-30 (×6): 150 mg via ORAL
  Filled 2018-03-27 (×10): qty 3

## 2018-03-27 MED ORDER — TRAZODONE HCL 50 MG PO TABS
50.0000 mg | ORAL_TABLET | Freq: Every evening | ORAL | Status: DC | PRN
  Administered 2018-03-27: 50 mg via ORAL
  Filled 2018-03-27: qty 1

## 2018-03-27 MED ORDER — HYDROCODONE-ACETAMINOPHEN 5-325 MG PO TABS: 1.0000 | ORAL_TABLET | Freq: Four times a day (QID) | ORAL | Status: DC | PRN

## 2018-03-27 MED ORDER — SODIUM CHLORIDE 0.9 % IV BOLUS
1000.0000 mL | Freq: Once | INTRAVENOUS | Status: AC
  Administered 2018-03-27: 1000 mL via INTRAVENOUS

## 2018-03-27 MED ORDER — DULOXETINE HCL 60 MG PO CPEP
60.0000 mg | ORAL_CAPSULE | Freq: Every day | ORAL | Status: DC
  Administered 2018-03-27 – 2018-03-29 (×3): 60 mg via ORAL
  Filled 2018-03-27 (×3): qty 1

## 2018-03-27 MED ORDER — BISACODYL 10 MG RE SUPP
10.0000 mg | Freq: Once | RECTAL | Status: AC
Start: 2018-03-28 — End: 2018-03-28
  Administered 2018-03-28: 10 mg via RECTAL
  Filled 2018-03-27: qty 1

## 2018-03-27 MED ORDER — SODIUM CHLORIDE 0.9% FLUSH: 3.0000 mL | INTRAVENOUS | Status: DC | PRN

## 2018-03-27 MED ORDER — HEPARIN SODIUM (PORCINE) 5000 UNIT/ML IJ SOLN
5000.0000 [IU] | Freq: Three times a day (TID) | INTRAMUSCULAR | Status: DC
  Administered 2018-03-27 – 2018-03-30 (×8): 5000 [IU] via SUBCUTANEOUS
  Filled 2018-03-27 (×8): qty 1

## 2018-03-27 MED ORDER — ACETAMINOPHEN 325 MG PO TABS: 650.0000 mg | ORAL_TABLET | Freq: Four times a day (QID) | ORAL | Status: DC | PRN

## 2018-03-27 MED ORDER — ALPRAZOLAM 0.25 MG PO TABS
0.2500 mg | ORAL_TABLET | Freq: Three times a day (TID) | ORAL | Status: DC | PRN
  Administered 2018-03-27: 0.25 mg via ORAL
  Filled 2018-03-27: qty 1

## 2018-03-27 MED ORDER — ALBUTEROL SULFATE (2.5 MG/3ML) 0.083% IN NEBU: 2.5000 mg | INHALATION_SOLUTION | RESPIRATORY_TRACT | Status: DC | PRN

## 2018-03-27 MED ORDER — SODIUM CHLORIDE 0.9 % IV SOLN: INTRAVENOUS | Status: DC

## 2018-03-27 NOTE — ED Provider Notes (Signed)
[facility] Provider Note: Lowella DellJ. Lane Garlon Tuggle, MD, FACEP  CSN: [id number] MRN: [id number] ARRIVAL: 03/27/18 at 1325 ROOM: [facility]11/AP[facility]11   CHIEF COMPLAINT  Vomiting  Level 5 caveat: Aphasia; altered mental status HISTORY OF PRESENT ILLNESS  03/27/18 4:39 PM Sharon Stevenson is a 57 y.o. female who is status post stroke with resultant aphasia, poor coordination and poor short-term memory but no significant focal weakness.  She has a history of frequent urinary tract infections, often accompanied by nausea and vomiting.  She is here with nausea and vomiting since the day before yesterday.  She has had multiple episodes of emesis.  Her urine was also noted to be dark and foul-smelling.  She was given Zofran at home without relief.  She is also more confused than usual, "talking out of her head".  Family is not aware of a fever.  She is blind in the left eye.  She is able to acknowledge a headache and dysuria.  She denies abdominal pain.   Past Medical History:  Diagnosis Date  . Menorrhagia with regular cycle 2014   Ablation endometrial, 01/02/2013  . Seizures (HCC)    seizures happened with her stroke, none since  . Stroke [facility](HCC) 2008    Past Surgical History:  Procedure Laterality Date  . CESAREAN SECTION    . DILITATION & CURRETTAGE/HYSTROSCOPY WITH THERMACHOICE ABLATION  01/02/2013   Procedure: DILATATION & CURETTAGE/HYSTEROSCOPY WITH THERMACHOICE ABLATION;  Surgeon: Tilda BurrowJohn V Ferguson, MD;  Location: [facility] ORS;  Service: Gynecology;  Laterality: N/A;  Total Ablation Time = 12 minutes 36 seconds     ; 86-88 deg C  . TUBAL LIGATION      History reviewed. No pertinent family history.  Social History   Tobacco Use  . Smoking status: Former Games developermoker  . Smokeless tobacco: Current User    Types: Chew  Substance Use Topics  . Alcohol use: No  . Drug use: No    Prior to Admission medications   Medication Sig Start Date End Date Taking? Authorizing Provider  ALPRAZolam Prudy Feeler(XANAX) 0.5 MG  tablet Take 0.5 mg by mouth 3 (three) times daily.    Yes [provider]  clopidogrel (PLAVIX) 75 MG tablet Take 75 mg by mouth every morning.    Yes [provider]  Dextromethorphan-Quinidine (NUEDEXTA) 20-10 MG CAPS Take 1 capsule by mouth at bedtime.   Yes [provider]  DULoxetine (CYMBALTA) 60 MG capsule Take 60 mg by mouth at bedtime.    Yes [provider]  HYDROcodone-acetaminophen (NORCO/VICODIN) 5-325 MG tablet Take 1 tablet by mouth 3 (three) times daily. 12/27/17  Yes [provider]  lactulose, encephalopathy, (CHRONULAC) 10 GM/15ML SOLN Take 30 mLs by mouth daily.  10/13/17  Yes [provider]  levETIRAcetam (KEPPRA) 500 MG tablet Take 500 mg by mouth 2 (two) times daily.   Yes [provider]  MOVANTIK 25 MG TABS tablet Take 25 mg by mouth daily. 11/15/17  Yes [provider]  pantoprazole (PROTONIX) 40 MG tablet Take 40 mg by mouth daily. 12/08/17  Yes [provider]  phenytoin (DILANTIN) 50 MG tablet Chew 150 mg by mouth 2 (two) times daily.    Yes [provider]  QUEtiapine (SEROQUEL) 400 MG tablet Take 400 mg by mouth at bedtime.   Yes [provider]  risperiDONE (RISPERDAL) 0.25 MG tablet Take 0.25 mg by mouth at bedtime. 12/15/17  Yes [provider]  simvastatin (ZOCOR) 20 MG tablet Take 20 mg by mouth  every morning.    Yes [provider]  sulfamethoxazole-trimethoprim (BACTRIM DS,SEPTRA DS) 800-160 MG tablet Take 1 tablet by mouth 2 (two) times daily. 7 day course starting on 03/16/2018    [provider]    Allergies Patient has no known allergies.   REVIEW OF SYSTEMS     PHYSICAL EXAMINATION  Initial Vital Signs Blood pressure 114/72, pulse 73, temperature 97.9 F (36.6 C), temperature source Tympanic, resp. rate 15, height 6' (1.829 m), weight 122.5 kg (270 lb), SpO2 99 %.  Examination General: Well-developed, obese female in no acute  distress; appearance consistent with age of record HENT: normocephalic; atraumatic Eyes: Right pupil round and reactive to light, left pupil dilated nonreactive; right extraocular muscles grossly intact but with limited ability to gaze left of midline; left eye fixed lateral gaze with minimal extraocular muscle movement Neck: supple Heart: regular rate and rhythm Lungs: clear to auscultation bilaterally Abdomen: soft; obese; nondistended; nontender; bowel sounds present Extremities: No deformity; pulses normal; no edema Neurologic: Awake, alert, able to answer simple questions but more complex speech is garbled; motor function intact in all extremities and symmetric Skin: Warm and dry   RESULTS  Summary of this visit's results, reviewed by myself:   EKG Interpretation  Date/Time:    Ventricular Rate:    PR Interval:    QRS Duration:   QT Interval:    QTC Calculation:   R Axis:     Text Interpretation:        Laboratory Studies: Results for orders placed or performed during the hospital encounter of 03/27/18 (from the past 24 hour(s))  Urinalysis, Routine w reflex microscopic     Status: Abnormal   Collection Time: 03/27/18  2:40 PM  Result Value Ref Range   Color, Urine YELLOW YELLOW   APPearance HAZY (A) CLEAR   Specific Gravity, Urine >1.030 (H) 1.005 - 1.030   pH 6.0 5.0 - 8.0   Glucose, UA NEGATIVE NEGATIVE mg/dL   Hgb urine dipstick TRACE (A) NEGATIVE   Bilirubin Urine NEGATIVE NEGATIVE   Ketones, ur 15 (A) NEGATIVE mg/dL   Protein, ur 161 (A) NEGATIVE mg/dL   Nitrite POSITIVE (A) NEGATIVE   Leukocytes, UA TRACE (A) NEGATIVE  Urinalysis, Microscopic (reflex)     Status: Abnormal   Collection Time: 03/27/18  2:40 PM  Result Value Ref Range   RBC / HPF 0-5 0 - 5 RBC/hpf   WBC, UA 6-30 0 - 5 WBC/hpf   Bacteria, UA MANY (A) NONE SEEN   Squamous Epithelial / LPF TOO NUMEROUS TO COUNT (A) NONE SEEN  Comprehensive metabolic panel     Status: Abnormal   Collection  Time: 03/27/18  3:24 PM  Result Value Ref Range   Sodium 143 135 - 145 mmol/L   Potassium 3.3 (L) 3.5 - 5.1 mmol/L   Chloride 100 (L) 101 - 111 mmol/L   CO2 28 22 - 32 mmol/L   Glucose, Bld 122 (H) 65 - 99 mg/dL   BUN 12 6 - 20 mg/dL   Creatinine, Ser 0.96 0.44 - 1.00 mg/dL   Calcium 9.8 8.9 - 04.5 mg/dL   Total Protein 9.4 (H) 6.5 - 8.1 g/dL   Albumin 4.1 3.5 - 5.0 g/dL   AST 31 15 - 41 U/L   ALT 31 14 - 54 U/L   Alkaline Phosphatase 188 (H) 38 - 126 U/L   Total Bilirubin 0.6 0.3 - 1.2 mg/dL   GFR calc non Af Amer >60 >60 mL/min  GFR calc Af Amer >60 >60 mL/min   Anion gap 15 5 - 15  CBC     Status: Abnormal   Collection Time: 03/27/18  3:24 PM  Result Value Ref Range   WBC 10.8 (H) 4.0 - 10.5 K/uL   RBC 5.24 (H) 3.87 - 5.11 MIL/uL   Hemoglobin 15.0 12.0 - 15.0 g/dL   HCT 82.9 (H) 56.2 - 13.0 %   MCV 91.4 78.0 - 100.0 fL   MCH 28.6 26.0 - 34.0 pg   MCHC 31.3 30.0 - 36.0 g/dL   RDW 86.5 78.4 - 69.6 %   Platelets 514 (H) 150 - 400 K/uL  Lipase, blood     Status: None   Collection Time: 03/27/18  3:24 PM  Result Value Ref Range   Lipase 22 11 - 51 U/L  Phenytoin level, total     Status: Abnormal   Collection Time: 03/27/18  4:41 PM  Result Value Ref Range   Phenytoin Lvl 2.5 (L) 10.0 - 20.0 ug/mL  hCG, serum, qualitative     Status: Abnormal   Collection Time: 03/27/18  7:56 PM  Result Value Ref Range   Preg, Serum WEAK POSITIVE (A) NEGATIVE   Imaging Studies: No results found.  ED COURSE and MDM  Nursing notes and initial vitals signs, including pulse oximetry, reviewed.  Vitals:   03/27/18 1800 03/27/18 1830 03/27/18 1900 03/27/18 2000  BP: 129/76 123/77 122/77 115/72  Pulse: 64 62 (!) 57 (!) 58  Resp: 14 19 18 12   Temp:      TempSrc:      SpO2: 100% 100% 98% 99%  Weight:      Height:       Nursing staff attempted to catheterize patient.  Only about 2 mL of viscous, opaque yellow urine was obtained.  This was sent for attempted urinalysis and culture.  IV  fluid bolus and Rocephin IV ordered for likely urinary tract infection with associated vomiting and altered mental status.  Antibiotics held at request of hospitalist Dr. Marisa Severin pending rehydration and repeat of urinalysis and urine culture.  PROCEDURES    ED DIAGNOSES     ICD-10-CM   1. Nausea and vomiting in adult R11.2   2. Dehydration E86.0       M[place]field Dann, Jonny Ruiz, MD 03/27/18 2047

## 2018-03-27 NOTE — ED Triage Notes (Signed)
Pt's family c/o strong urine odor, dark color, decreased urine output, confusion, vomiting since Sat. Pt has been given Zofran at home and still vomiting. Family reports pt is "talking out of her head".

## 2018-03-27 NOTE — H&P (Signed)
Patient Demographics:    Sharon Stevenson, is a 57 y.o. female  MRN: [id number]   DOB - 10/25/1961  Admit Date - 03/27/2018  Outpatient Primary MD for the patient is Oval Linsey, MD   Assessment & Plan:    Principal Problem:   Emesis, persistent Active Problems:   Dehydration   Confusion   1)Confusional Episodes- ??? Related to dehydration, rule out UTI, may be related to opiate and benzo use,   Per family members seizures appears unlikely, we will hydrate, repeat UA with culture if able, consider IV antibiotics if REPEAT (good urine sample) urinalysis suggest UTI, urine sample obtained in the ED appeared contaminated with too numerous to count epithelial cells, reduce frequency of opiates and benzos and see if mental status improves  2)H/o SZ-as per family no seizures since 2008 (had seizures around the time she had a stroke in 2008), Dilantin level was subtherapeutic presumably due to persistent emesis for the last couple of days, replace and recheck Dilantin, continue Keppra.  Per family members seizures appears unlikely  3)H/o Prior CVA-continue Plavix and simvastatin  4) intractable emesis-abdominal x-rays without acute findings, lipase is normal, ???  Related to UTI, await repeat urine sample (see # 1 above)  treat empirically with antiemetics  5) chronic constipation-continue lactulose, last BM was 03/26/2018 it was hard  6) history of behavioral disturbance-apparently patient has had bouts of agitation and other behavioral problems since his stroke in 2008, continue Seroquel 400 mg qhs, stop risperidone  With History of - Reviewed by me  Past Medical History:  Diagnosis Date  . Menorrhagia with regular cycle 2014   Ablation endometrial, 01/02/2013  . Seizures (HCC)    seizures happened with her  stroke, none since  . Stroke [facility]) 2008      Past Surgical History:  Procedure Laterality Date  . CESAREAN SECTION    . DILITATION & CURRETTAGE/HYSTROSCOPY WITH THERMACHOICE ABLATION  01/02/2013   Procedure: DILATATION & CURETTAGE/HYSTEROSCOPY WITH THERMACHOICE ABLATION;  Surgeon: [name], MD;  Location: [facility] ORS;  Service: Gynecology;  Laterality: N/A;  Total Ablation Time = 12 minutes 36 seconds     ; 86-88 deg C  . TUBAL LIGATION      Chief Complaint  Patient presents with  . Vomiting      HPI:    Sharon Stevenson  is a 57 y.o. female past medical history relevant for probable CVA in 2008 with resultant aphasia neuromuscular deficits and behavioral disturbance as well as seizures who presents to the ED with intractable emesis x 2 days, patient is a very poor historian she is not really able to tell me if she has dysuria, however she has not been voiding much at all and when bladder scan was done in the ED she did not have much urine the bladder when urine catheter was placed to obtain urine sample patient had less than 10 mL of very gunky urine in her  bladder----she appears dehydrated  According to patient's son at bedside and patient's daughter-in-law patient has chronic constipation and occasionally will have episodes of emesis either due to constipation or UTIs  Attempt was made to obtain UA in the ED catheterized specimen look very gunky with loss of epithelial cells, patient had less than 10 mL of urine in the bladder.... Clinically appears dehydrated, IV fluids were initiated  Emesis apparently has been without bile or blood  Per family members patient has also been increasingly more confused and disoriented..  No URI symptoms, no abdominal pain, no fevers, no cough    Review of systems:    In addition to the HPI above,   A full 12 point Review of 10 Systems was done, except as stated above, all other Review of 10 Systems were negative.    Social History:  Reviewed by  me    Social History   Tobacco Use  . Smoking status: Former Games developermoker  . Smokeless tobacco: Current User    Types: Chew  Substance Use Topics  . Alcohol use: No       Family History :  Reviewed by me   History reviewed. No pertinent family history.    Home Medications:   Prior to Admission medications   Medication Sig Start Date End Date Taking? Authorizing Provider  ALPRAZolam Prudy Feeler(XANAX) 0.5 MG tablet Take 0.5 mg by mouth 3 (three) times daily.    Yes [provider]  clopidogrel (PLAVIX) 75 MG tablet Take 75 mg by mouth every morning.    Yes [provider]  Dextromethorphan-Quinidine (NUEDEXTA) 20-10 MG CAPS Take 1 capsule by mouth at bedtime.   Yes [provider]  DULoxetine (CYMBALTA) 60 MG capsule Take 60 mg by mouth at bedtime.    Yes [provider]  HYDROcodone-acetaminophen (NORCO/VICODIN) 5-325 MG tablet Take 1 tablet by mouth 3 (three) times daily. 12/27/17  Yes [provider]  lactulose, encephalopathy, (CHRONULAC) 10 GM/15ML SOLN Take 30 mLs by mouth daily.  10/13/17  Yes [provider]  levETIRAcetam (KEPPRA) 500 MG tablet Take 500 mg by mouth 2 (two) times daily.   Yes [provider]  MOVANTIK 25 MG TABS tablet Take 25 mg by mouth daily. 11/15/17  Yes [provider]  pantoprazole (PROTONIX) 40 MG tablet Take 40 mg by mouth daily. 12/08/17  Yes [provider]  phenytoin (DILANTIN) 50 MG tablet Chew 150 mg by mouth 2 (two) times daily.    Yes [provider]  QUEtiapine (SEROQUEL) 400 MG tablet Take 400 mg by mouth at bedtime.   Yes [provider]  risperiDONE (RISPERDAL) 0.25 MG tablet Take 0.25 mg by mouth at bedtime. 12/15/17  Yes [provider]  simvastatin (ZOCOR) 20 MG tablet Take 20 mg by mouth every morning.    Yes [provider]  sulfamethoxazole-trimethoprim (BACTRIM DS,SEPTRA DS) 800-160 MG tablet Take 1 tablet by mouth 2 (two) times daily.  7 day course starting on 03/16/2018    [provider]     Allergies:    No Known Allergies   Physical Exam:   Vitals  Blood pressure 121/79, pulse 61, temperature 98.9 F (37.2 C), resp. rate 18, height 6' (1.829 m), weight 97.6 kg (215 lb 2.7 oz), SpO2 98 %.  Physical Examination: General appearance - alert,  and in no distress  Mental status - alert, oriented to person, , not oriented to place or time  eyes -left eye vision loss from previous stroke Neck -  supple, no JVD elevation , prior tracheostomy tube scar Chest - clear  to auscultation bilaterally, symmetrical air movement,  Heart - S1 and S2 normal,  Abdomen - soft, nontender, nondistended, scars from previous PEG tube placement, no CVA area tenderness Neurological -neuromuscular deficits from prior stroke identified members at baseline with no new deficits at this time (at baseline patient is able to stand with assistance and take a few steps with assistance), facial asymmetry is not new, left eye visual loss is not new,  patient has speech deficits and cognitive issues which are not new extremities - no pedal edema noted, intact peripheral pulses  Skin -very dry with decreased skin turgor     Data Review:    CBC Recent Labs  Lab 03/27/18 1524  WBC 10.8*  HGB 15.0  HCT 47.9*  PLT 514*  MCV 91.4  MCH 28.6  MCHC 31.3  RDW 13.3   ------------------------------------------------------------------------------------------------------------------  Chemistries  Recent Labs  Lab 03/27/18 1524  NA 143  K 3.3*  CL 100*  CO2 28  GLUCOSE 122*  BUN 12  CREATININE 0.72  CALCIUM 9.8  AST 31  ALT 31  ALKPHOS 188*  BILITOT 0.6   ------------------------------------------------------------------------------------------------------------------ estimated creatinine clearance is 104 mL/min (by C-G formula based on SCr of 0.72  mg/dL). ------------------------------------------------------------------------------------------------------------------ No results for input(s): TSH, T4TOTAL, T3FREE, THYROIDAB in the last 72 hours.  Invalid input(s): FREET3   Coagulation profile No results for input(s): INR, PROTIME in the last 168 hours. ------------------------------------------------------------------------------------------------------------------- No results for input(s): DDIMER in the last 72 hours. -------------------------------------------------------------------------------------------------------------------  Cardiac Enzymes No results for input(s): CKMB, TROPONINI, MYOGLOBIN in the last 168 hours.  Invalid input(s): CK ------------------------------------------------------------------------------------------------------------------ No results found for: BNP   ---------------------------------------------------------------------------------------------------------------  Urinalysis    Component Value Date/Time   COLORURINE YELLOW 03/27/2018 1440   APPEARANCEUR HAZY (A) 03/27/2018 1440   LABSPEC >1.030 (H) 03/27/2018 1440   PHURINE 6.0 03/27/2018 1440   GLUCOSEU NEGATIVE 03/27/2018 1440   HGBUR TRACE (A) 03/27/2018 1440   BILIRUBINUR NEGATIVE 03/27/2018 1440   KETONESUR 15 (A) 03/27/2018 1440   PROTEINUR 100 (A) 03/27/2018 1440   UROBILINOGEN 0.2 09/24/2007 1827   NITRITE POSITIVE (A) 03/27/2018 1440   LEUKOCYTESUR TRACE (A) 03/27/2018 1440    ----------------------------------------------------------------------------------------------------------------   Imaging Results:    Dg Abd Acute W/chest  Result Date: 03/27/2018 CLINICAL DATA:  Acute onset of nausea and vomiting. Malodorous urine. EXAM: DG ABDOMEN ACUTE W/ 1V CHEST COMPARISON:  Chest radiograph performed 01/12/2017, and lumbar spine radiographs performed 01/05/2012 FINDINGS: The lungs are well-aerated and clear. There is no  evidence of focal opacification, pleural effusion or pneumothorax. The cardiomediastinal silhouette is within normal limits. The visualized bowel gas pattern is unremarkable. Scattered stool and air are seen within the colon; there is no evidence of small bowel dilatation to suggest obstruction. No free intra-abdominal air is identified on the provided upright view. Clips are noted within the right upper quadrant, reflecting prior cholecystectomy. No acute osseous abnormalities are seen; the sacroiliac joints are unremarkable in appearance. Degenerative change is noted along the lower lumbar spine. Clips are noted within the right upper quadrant, reflecting prior cholecystectomy. IMPRESSION: 1. Unremarkable bowel gas pattern; no free intra-abdominal air seen. 2. No acute cardiopulmonary process seen. Electronically Signed   By: Roanna Raider M.D.   On: 03/27/2018 21:46    Radiological Exams on Admission: Dg Abd Acute W/chest  Result Date: 03/27/2018 CLINICAL DATA:  Acute onset of nausea and vomiting. Malodorous urine. EXAM: DG ABDOMEN  ACUTE W/ 1V CHEST COMPARISON:  Chest radiograph performed 01/12/2017, and lumbar spine radiographs performed 01/05/2012 FINDINGS: The lungs are well-aerated and clear. There is no evidence of focal opacification, pleural effusion or pneumothorax. The cardiomediastinal silhouette is within normal limits. The visualized bowel gas pattern is unremarkable. Scattered stool and air are seen within the colon; there is no evidence of small bowel dilatation to suggest obstruction. No free intra-abdominal air is identified on the provided upright view. Clips are noted within the right upper quadrant, reflecting prior cholecystectomy. No acute osseous abnormalities are seen; the sacroiliac joints are unremarkable in appearance. Degenerative change is noted along the lower lumbar spine. Clips are noted within the right upper quadrant, reflecting prior cholecystectomy. IMPRESSION: 1.  Unremarkable bowel gas pattern; no free intra-abdominal air seen. 2. No acute cardiopulmonary process seen. Electronically Signed   By: Roanna Raider M.D.   On: 03/27/2018 21:46    DVT Prophylaxis -SCD /heparin AM Labs Ordered, also please review Full Orders  Family Communication: Admission, patients condition and plan of care including tests being ordered have been discussed with the patient and son who indicate understanding and agree with the plan   Code Status - Full Code  Likely DC to  home  Condition   stable  Shon Hale M.D on 03/27/2018 at 11:47 PM   Between 7am to 7pm - Pager - [phone] After 7pm go to [email or web address] - password [email or web address]  [facility] - Office  [phone]  Voice Recognition Reubin Milan dictation system was used to create this note, attempts have been made to correct errors. Please contact the author with questions and/or clarifications.

## 2018-03-28 LAB — CBC
HEMATOCRIT: 40.2 % (ref 36.0–46.0)
HEMATOCRIT: 37.7 % (ref 36.0–46.0)
HEMOGLOBIN: 11.5 g/dL — AB (ref 12.0–15.0)
Hemoglobin: 12.4 g/dL (ref 12.0–15.0)
MCH: 28.1 pg (ref 26.0–34.0)
MCH: 28.1 pg (ref 26.0–34.0)
MCHC: 30.8 g/dL (ref 30.0–36.0)
MCHC: 30.5 g/dL (ref 30.0–36.0)
MCV: 91.2 fL (ref 78.0–100.0)
MCV: 92.2 fL (ref 78.0–100.0)
Platelets: 332 10*3/uL (ref 150–400)
Platelets: 342 10*3/uL (ref 150–400)
RBC: 4.41 MIL/uL (ref 3.87–5.11)
RBC: 4.09 MIL/uL (ref 3.87–5.11)
RDW: 13.1 % (ref 11.5–15.5)
RDW: 13.2 % (ref 11.5–15.5)
WBC: 8.4 10*3/uL (ref 4.0–10.5)
WBC: 6.8 10*3/uL (ref 4.0–10.5)

## 2018-03-28 LAB — BASIC METABOLIC PANEL
Anion gap: 9 (ref 5–15)
BUN: 11 mg/dL (ref 6–20)
CHLORIDE: 109 mmol/L (ref 101–111)
CO2: 25 mmol/L (ref 22–32)
Calcium: 8.6 mg/dL — ABNORMAL LOW (ref 8.9–10.3)
Creatinine, Ser: 0.52 mg/dL (ref 0.44–1.00)
GFR calc Af Amer: >60 mL/min (ref >60)
GFR calc non Af Amer: >60 mL/min (ref >60)
Glucose, Bld: 115 mg/dL — ABNORMAL HIGH (ref 65–99)
POTASSIUM: 3.8 mmol/L (ref 3.5–5.1)
SODIUM: 143 mmol/L (ref 135–145)

## 2018-03-28 LAB — VITAMIN B12: Vitamin B-12: 278 pg/mL (ref 180–914)

## 2018-03-28 LAB — SEDIMENTATION RATE: SED RATE: 27 mm/h — AB (ref 0–22)

## 2018-03-28 LAB — TSH: TSH: 0.659 u[IU]/mL (ref 0.350–4.500)

## 2018-03-28 LAB — HIV ANTIBODY (ROUTINE TESTING W REFLEX): HIV Screen 4th Generation wRfx: NONREACTIVE

## 2018-03-28 LAB — FOLATE: Folate: 8.1 ng/mL (ref >5.9)

## 2018-03-28 LAB — HCG, QUANTITATIVE, PREGNANCY: hCG, Beta Chain, Quant, S: 4 m[IU]/mL (ref <5)

## 2018-03-28 MED ORDER — ALPRAZOLAM 0.25 MG PO TABS: 0.2500 mg | ORAL_TABLET | Freq: Three times a day (TID) | ORAL | Status: DC | PRN

## 2018-03-28 NOTE — Progress Notes (Signed)
Late entry  Patient's family requested dose of trazodone v. seroquel as seroquel has been ineffective for patient lately. See MAR. Patient also had moderate amount of urine in diaper on removal before placement of purewick.

## 2018-03-28 NOTE — Progress Notes (Signed)
Patient admitted with nausea and vomiting which is now much improved is tolerating soft diet pregnancy test "weakly positive" will perform quantitative hCG LMP 4 years ago at 57 years of age patient has periods of outbursts and agitation which are chronic in nature due to CVA was put on Nuedexta for this as well as other impulse modifying neurotropic drugs caregiver states she becomes lethargic with Xanax 0.5 3 times daily daily as needed we will decrease to 0.25 and observe will do dementia profile Sharon Stevenson [id number] DOB: Feb 13, 1961 DOA: 03/27/2018 PCP: Oval Linseyondiego, Luiza Carranco, MD   Physical Exam: Blood pressure 124/75, pulse (!) 58, temperature 97.9 F (36.6 C), temperature source Oral, resp. rate 18, height 6' (1.829 m), weight 97.6 kg (215 lb 2.7 oz), SpO2 100 %.  Lungs clear to A&P no rales wheezes rhonchi heart regular rhythm no S3 S1 adhesions rubs abdomen obese soft nontender bowel sounds normoactive   Investigations:  No results found for this or any previous visit (from the past 240 hour(s)).   Basic Metabolic Panel: Recent Labs    03/27/18 1524 03/28/18 0808  NA 143 143  K 3.3* 3.8  CL 100* 109  CO2 28 25  GLUCOSE 122* 115*  BUN 12 11  CREATININE 0.72 0.52  CALCIUM 9.8 8.6*   Liver Function Tests: Recent Labs    03/27/18 1524  AST 31  ALT 31  ALKPHOS 188*  BILITOT 0.6  PROT 9.4*  ALBUMIN 4.1     CBC: Recent Labs    03/27/18 1524 03/28/18 0808  WBC 10.8* 8.4  HGB 15.0 12.4  HCT 47.9* 40.2  MCV 91.4 91.2  PLT 514* 332    Dg Abd Acute W/chest  Result Date: 03/27/2018 CLINICAL DATA:  Acute onset of nausea and vomiting. Malodorous urine. EXAM: DG ABDOMEN ACUTE W/ 1V CHEST COMPARISON:  Chest radiograph performed 01/12/2017, and lumbar spine radiographs performed 01/05/2012 FINDINGS: The lungs are well-aerated and clear. There is no evidence of focal opacification, pleural effusion or pneumothorax. The cardiomediastinal silhouette is within normal  limits. The visualized bowel gas pattern is unremarkable. Scattered stool and air are seen within the colon; there is no evidence of small bowel dilatation to suggest obstruction. No free intra-abdominal air is identified on the provided upright view. Clips are noted within the right upper quadrant, reflecting prior cholecystectomy. No acute osseous abnormalities are seen; the sacroiliac joints are unremarkable in appearance. Degenerative change is noted along the lower lumbar spine. Clips are noted within the right upper quadrant, reflecting prior cholecystectomy. IMPRESSION: 1. Unremarkable bowel gas pattern; no free intra-abdominal air seen. 2. No acute cardiopulmonary process seen. Electronically Signed   By: Roanna RaiderJeffery  Chang M.D.   On: 03/27/2018 21:46      Medications:   Impression: *Principal Problem:   Emesis, persistent Active Problems:   Dehydration   Confusion     Plan: Quantitative hCG.  Decrease Xanax to 0.25 p.o. 3 times daily observe observe tolerance of soft diet  Consultants:    Procedures   Antibiotics:        Time spent: 30 minutes   LOS: 0 days   Cray Monnin M   03/28/2018, 1:30 PM

## 2018-03-28 NOTE — Progress Notes (Signed)
Patient bladder scanned this morning, no output since approximately midnight. Bladder scan shows 135 ml, patient does not feel need to urinate, will continue to monitor.

## 2018-03-29 LAB — URINALYSIS, ROUTINE W REFLEX MICROSCOPIC
BILIRUBIN URINE: NEGATIVE
Glucose, UA: NEGATIVE mg/dL
Hgb urine dipstick: NEGATIVE
Ketones, ur: NEGATIVE mg/dL
LEUKOCYTES UA: NEGATIVE
Nitrite: NEGATIVE
Protein, ur: NEGATIVE mg/dL
SPECIFIC GRAVITY, URINE: 1.015 (ref 1.005–1.030)
pH: 7 (ref 5.0–8.0)

## 2018-03-29 LAB — BASIC METABOLIC PANEL
Anion gap: 10 (ref 5–15)
BUN: 7 mg/dL (ref 6–20)
CHLORIDE: 108 mmol/L (ref 101–111)
CO2: 24 mmol/L (ref 22–32)
Calcium: 8.6 mg/dL — ABNORMAL LOW (ref 8.9–10.3)
Creatinine, Ser: 0.58 mg/dL (ref 0.44–1.00)
GFR calc non Af Amer: >60 mL/min (ref >60)
Glucose, Bld: 91 mg/dL (ref 65–99)
Potassium: 4.1 mmol/L (ref 3.5–5.1)
Sodium: 142 mmol/L (ref 135–145)

## 2018-03-29 LAB — RPR: RPR: NONREACTIVE

## 2018-03-29 LAB — HIV ANTIBODY (ROUTINE TESTING W REFLEX): HIV Screen 4th Generation wRfx: NONREACTIVE

## 2018-03-29 NOTE — Progress Notes (Signed)
Quantitative hCG less than 4.  And emesis yesterday with foods.  Although she is majority of her food remained digested observe nausea and emesis today electrolytes appear within normal parameters.  Xanax was diminished 0.25 p.o. 3 times daily yesterday her aggressive outbursts are somewhat well controlled with Nuedexta Cymbalta and low-dose Xanax. Sharon Stevenson [id number] DOB: May 08, 1961 DOA: 03/27/2018 PCP: Lucia Gaskins, MD   Physical Exam: Blood pressure (!) 148/75, pulse (!) 55, temperature (!) 97.5 F (36.4 C), temperature source Oral, resp. rate 18, height 6' (1.829 m), weight 97.6 kg (215 lb 2.7 oz), SpO2 100 %.  Lungs diminished breath sounds in the bases no rales wheeze or rhonchi appreciable heart regular rhythm no S3 or S4 no heaves or rubs abdomen obese soft nontender bowel sounds normoactive no guarding or rebound no masses no megaly   Investigations:  No results found for this or any previous visit (from the past 240 hour(s)).   Basic Metabolic Panel: Recent Labs    03/28/18 0808 03/29/18 0438  NA 143 142  K 3.8 4.1  CL 109 108  CO2 25 24  GLUCOSE 115* 91  BUN 11 7  CREATININE 0.52 0.58  CALCIUM 8.6* 8.6*   Liver Function Tests: Recent Labs    03/27/18 1524  AST 31  ALT 31  ALKPHOS 188*  BILITOT 0.6  PROT 9.4*  ALBUMIN 4.1     CBC: Recent Labs    03/28/18 0808 03/28/18 1502  WBC 8.4 6.8  HGB 12.4 11.5*  HCT 40.2 37.7  MCV 91.2 92.2  PLT 332 342    Dg Abd Acute W/chest  Result Date: 03/27/2018 CLINICAL DATA:  Acute onset of nausea and vomiting. Malodorous urine. EXAM: DG ABDOMEN ACUTE W/ 1V CHEST COMPARISON:  Chest radiograph performed 01/12/2017, and lumbar spine radiographs performed 01/05/2012 FINDINGS: The lungs are well-aerated and clear. There is no evidence of focal opacification, pleural effusion or pneumothorax. The cardiomediastinal silhouette is within normal limits. The visualized bowel gas pattern is unremarkable. Scattered stool  and air are seen within the colon; there is no evidence of small bowel dilatation to suggest obstruction. No free intra-abdominal air is identified on the provided upright view. Clips are noted within the right upper quadrant, reflecting prior cholecystectomy. No acute osseous abnormalities are seen; the sacroiliac joints are unremarkable in appearance. Degenerative change is noted along the lower lumbar spine. Clips are noted within the right upper quadrant, reflecting prior cholecystectomy. IMPRESSION: 1. Unremarkable bowel gas pattern; no free intra-abdominal air seen. 2. No acute cardiopulmonary process seen. Electronically Signed   By: Garald Balding M.D.   On: 03/27/2018 21:46      Medications:   Impression:  Principal Problem:   Emesis, persistent Active Problems:   Dehydration   Confusion     Plan: If patient able to tolerate p.o. food will discharge in 24 hours with home health nurse.  Monitor be met in a.m.  Consultants:    Procedures   Antibiotics:          Time spent: 30 minutes   LOS: 0 days   Gael Londo M   03/29/2018, 6:55 AM

## 2018-03-30 LAB — URINE CULTURE: Special Requests: NORMAL

## 2018-03-30 LAB — BASIC METABOLIC PANEL
Anion gap: 11 (ref 5–15)
BUN: 8 mg/dL (ref 6–20)
CHLORIDE: 108 mmol/L (ref 101–111)
CO2: 24 mmol/L (ref 22–32)
CREATININE: 0.66 mg/dL (ref 0.44–1.00)
Calcium: 8.4 mg/dL — ABNORMAL LOW (ref 8.9–10.3)
GFR calc Af Amer: >60 mL/min (ref >60)
GFR calc non Af Amer: >60 mL/min (ref >60)
GLUCOSE: 113 mg/dL — AB (ref 65–99)
Potassium: 3.6 mmol/L (ref 3.5–5.1)
SODIUM: 143 mmol/L (ref 135–145)

## 2018-03-30 NOTE — Discharge Summary (Signed)
Physician Discharge Summary  Sharon GaskinsDebra A Stevenson [id number] DOB: May 20, 1961 DOA: 03/27/2018  PCP: Oval Linseyondiego, Geary Rufo, MD  Admit date: 03/27/2018 Discharge date: 03/30/2018   Recommendations for Outpatient Follow-up:  Patient is advised to discontinue Xanax do not take any opioid analgesia.  Stop Risperdal.  Continue all other preadmission hospital medications including seizure medicines and neurotropic's as previously prescribed.  She is likewise advised to follow-up in my office within 1 week's time to assess her progress Discharge Diagnoses:  Principal Problem:   Emesis, persistent Active Problems:   Dehydration   Confusion   Discharge Condition: Murlean HarkGood  Filed Weights   03/27/18 1433 03/27/18 2100  Weight: 122.5 kg (270 lb) 97.6 kg (215 lb 2.7 oz)    History of present illness:  Patient is a 57 year old white female with a history of CVA with concomitant neuromuscular defect.  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  She has aggressive outbursts which are chronic in nature these are well controlled on Nuedexta Cymbalta as well as Keppra and Dilantin.  She was admitted to the hospital with nausea and emesis there was no melena hematemesis or hematochezia noted she was thought to have altered mental status however this appears to be her baseline.  She had a clear liquid diet and antiemetics her nausea and vomiting diminished within 24 to 36 hours she had a diet advanced to seem to tolerate this well.  Her caregiver describes some regurgitation of some food stuff however no frank vomiting or hematemesis noted we discontinued her Xanax while in the hospital and discontinued her opiates which I do not believe were given as an outpatient.  Her behavior remained stable and normal her electrolyte balance remain normal and she tolerated food well and was subsequently discharged  Hospital Course:  See HPI above  Procedures:  Consultations:  Discharge  Instructions  Discharge Instructions    Discharge instructions   Complete by:  As directed    Discharge patient   Complete by:  As directed    Discharge disposition:  01-Home or Self Care   Discharge patient date:  03/30/2018     Allergies as of 03/30/2018   No Known Allergies     Medication List    STOP taking these medications   ALPRAZolam 0.5 MG tablet Commonly known as:  XANAX   HYDROcodone-acetaminophen 5-325 MG tablet Commonly known as:  NORCO/VICODIN   lactulose (encephalopathy) 10 GM/15ML Soln Commonly known as:  CHRONULAC   MOVANTIK 25 MG Tabs tablet Generic drug:  naloxegol oxalate   risperiDONE 0.25 MG tablet Commonly known as:  RISPERDAL   sulfamethoxazole-trimethoprim 800-160 MG tablet Commonly known as:  BACTRIM DS,SEPTRA DS     TAKE these medications   clopidogrel 75 MG tablet Commonly known as:  PLAVIX Take 75 mg by mouth every morning.   DULoxetine 60 MG capsule Commonly known as:  CYMBALTA Take 60 mg by mouth at bedtime.   levETIRAcetam 500 MG tablet Commonly known as:  KEPPRA Take 500 mg by mouth 2 (two) times daily.   NUEDEXTA 20-10 MG Caps Generic drug:  Dextromethorphan-quiNIDine Take 1 capsule by mouth at bedtime.   pantoprazole 40 MG tablet Commonly known as:  PROTONIX Take 40 mg by mouth daily.   phenytoin 50 MG tablet Commonly known as:  DILANTIN Chew 150 mg by mouth 2 (two) times daily.   QUEtiapine 400 MG tablet Commonly known as:  SEROQUEL Take 400 mg by mouth at bedtime.   simvastatin 20  MG tablet Commonly known as:  ZOCOR Take 20 mg by mouth every morning.      No Known Allergies    The results of significant diagnostics from this hospitalization (including imaging, microbiology, ancillary and laboratory) are listed below for reference.    Significant Diagnostic Studies: Dg Abd Acute W/chest  Result Date: 03/27/2018 CLINICAL DATA:  Acute onset of nausea and vomiting. Malodorous urine. EXAM: DG ABDOMEN ACUTE  W/ 1V CHEST COMPARISON:  Chest radiograph performed 01/12/2017, and lumbar spine radiographs performed 01/05/2012 FINDINGS: The lungs are well-aerated and clear. There is no evidence of focal opacification, pleural effusion or pneumothorax. The cardiomediastinal silhouette is within normal limits. The visualized bowel gas pattern is unremarkable. Scattered stool and air are seen within the colon; there is no evidence of small bowel dilatation to suggest obstruction. No free intra-abdominal air is identified on the provided upright view. Clips are noted within the right upper quadrant, reflecting prior cholecystectomy. No acute osseous abnormalities are seen; the sacroiliac joints are unremarkable in appearance. Degenerative change is noted along the lower lumbar spine. Clips are noted within the right upper quadrant, reflecting prior cholecystectomy. IMPRESSION: 1. Unremarkable bowel gas pattern; no free intra-abdominal air seen. 2. No acute cardiopulmonary process seen. Electronically Signed   By: Roanna Raider M.D.   On: 03/27/2018 21:46    Microbiology: Recent Results (from the past 240 hour(s))  Urine culture     Status: Abnormal (Preliminary result)   Collection Time: 03/27/18  4:54 PM  Result Value Ref Range Status   Specimen Description   Final    URINE, RANDOM Performed at [facility], [address] [place], [place] [address]    Special Requests   Final    NONE Performed at [facility], [address], [place], [place] [address]    Culture >=100,000 COLONIES/mL ESCHERICHIA COLI (A)  Final   Report Status PENDING  Incomplete     Labs: Basic Metabolic Panel: Recent Labs  Lab 03/27/18 1524 03/28/18 0808 03/29/18 0438 03/30/18 0351  NA 143 143 142 143  K 3.3* 3.8 4.1 3.6  CL 100* 109 108 108  CO2 28 25 24 24   GLUCOSE 122* 115* 91 113*  BUN 12 11 7 8   CREATININE 0.72 0.52 0.58 0.66  CALCIUM 9.8 8.6* 8.6* 8.4*   Liver Function Tests: Recent Labs  Lab 03/27/18 1524  AST  31  ALT 31  ALKPHOS 188*  BILITOT 0.6  PROT 9.4*  ALBUMIN 4.1   Recent Labs  Lab 03/27/18 1524  LIPASE 22   No results for input(s): AMMONIA in the last 168 hours. CBC: Recent Labs  Lab 03/27/18 1524 03/28/18 0808 03/28/18 1502  WBC 10.8* 8.4 6.8  HGB 15.0 12.4 11.5*  HCT 47.9* 40.2 37.7  MCV 91.4 91.2 92.2  PLT 514* 332 342   Cardiac Enzymes: No results for input(s): CKTOTAL, CKMB, CKMBINDEX, TROPONINI in the last 168 hours. BNP: BNP (last 3 results) No results for input(s): BNP in the last 8760 hours.  ProBNP (last 3 results) No results for input(s): PROBNP in the last 8760 hours.  CBG: No results for input(s): GLUCAP in the last 168 hours.     Signed:  Isabella Stalling   Pager: [phone] 03/30/2018, 7:06 AM

## 2018-03-30 NOTE — Care Management Note (Signed)
Case Management Note  Patient Details  Name: Sharon Stevenson MRN: [id number] Date of Birth: [birth year]  Subjective/Objective:      Admitted with persistent emesis. Pt from home, has family member who works as her aid. She has PCP, transportation and insurance with drug coverage.               Action/Plan: DC home today with resumption of previous care giving arrangements. [facility] no [facility] placed. Upon clarification MD wishes to resume aid services, not initiate HH services.   Expected Discharge Date:  03/30/18               Expected Discharge Plan:  Home/Self Care  In-House Referral:  NA  Discharge planning Services  CM Consult  Post Acute Care Choice:  NA Choice offered to:  NA  Status of Service:  Completed, signed off  If discussed at Long Length of Stay Meetings, dates discussed:    Additional Comments:  Malcolm MetroChildress, Dexter Sauser Demske, RN 03/30/2018, 10:37 AM

## 2018-03-30 NOTE — Progress Notes (Signed)
Discharge papers signed by Cristi, son's fiance.  To call Dr. Janna Archondiego for appt in one week.

## 2018-03-30 NOTE — Discharge Instructions (Signed)
SEE DR, DONDIEGO IN ONE WEEK

## 2018-08-16 IMAGING — DX DG CHEST 2V
2 series · 2 of 2 positions shown · non-contrast
Comparison: May 23, 2017

CLINICAL DATA: Decreased oxygen saturation.  Cough.

EXAM:
CHEST  2 VIEW

[chest lat]
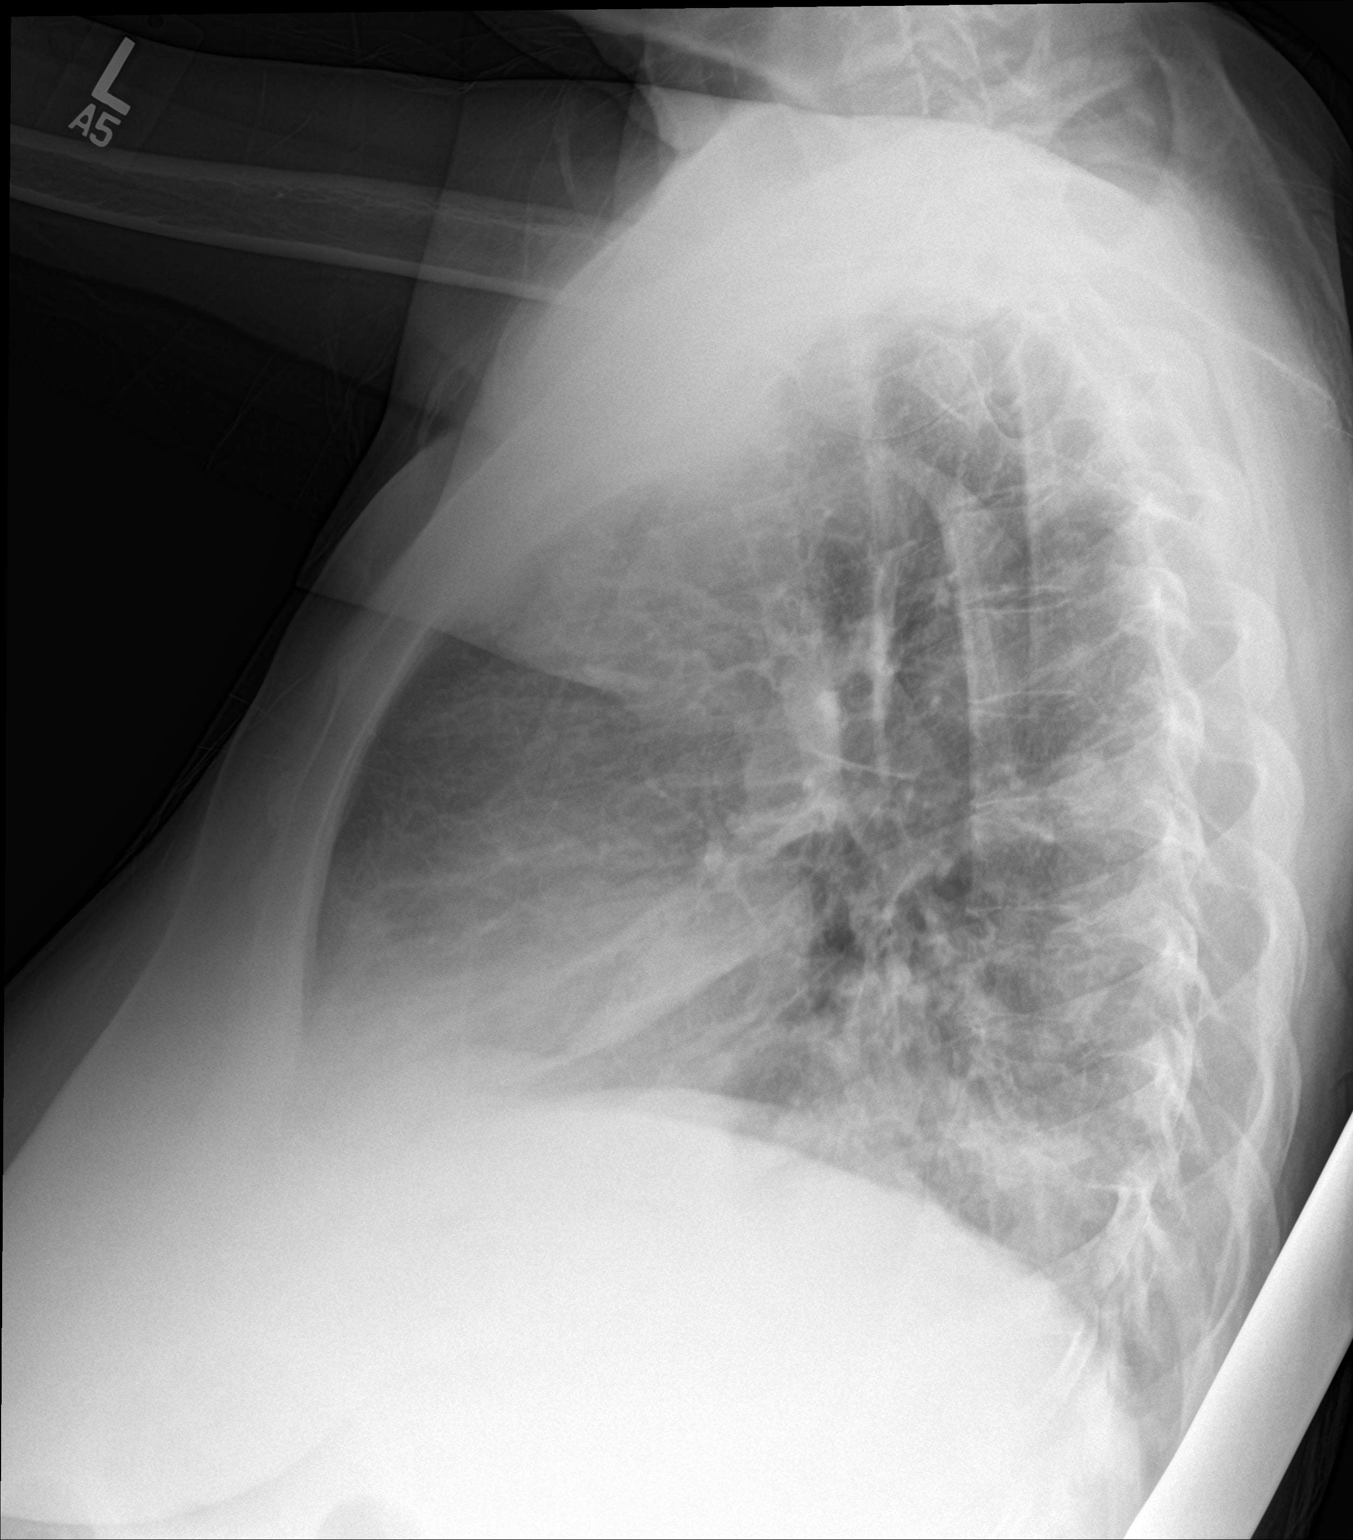

[chest ap]
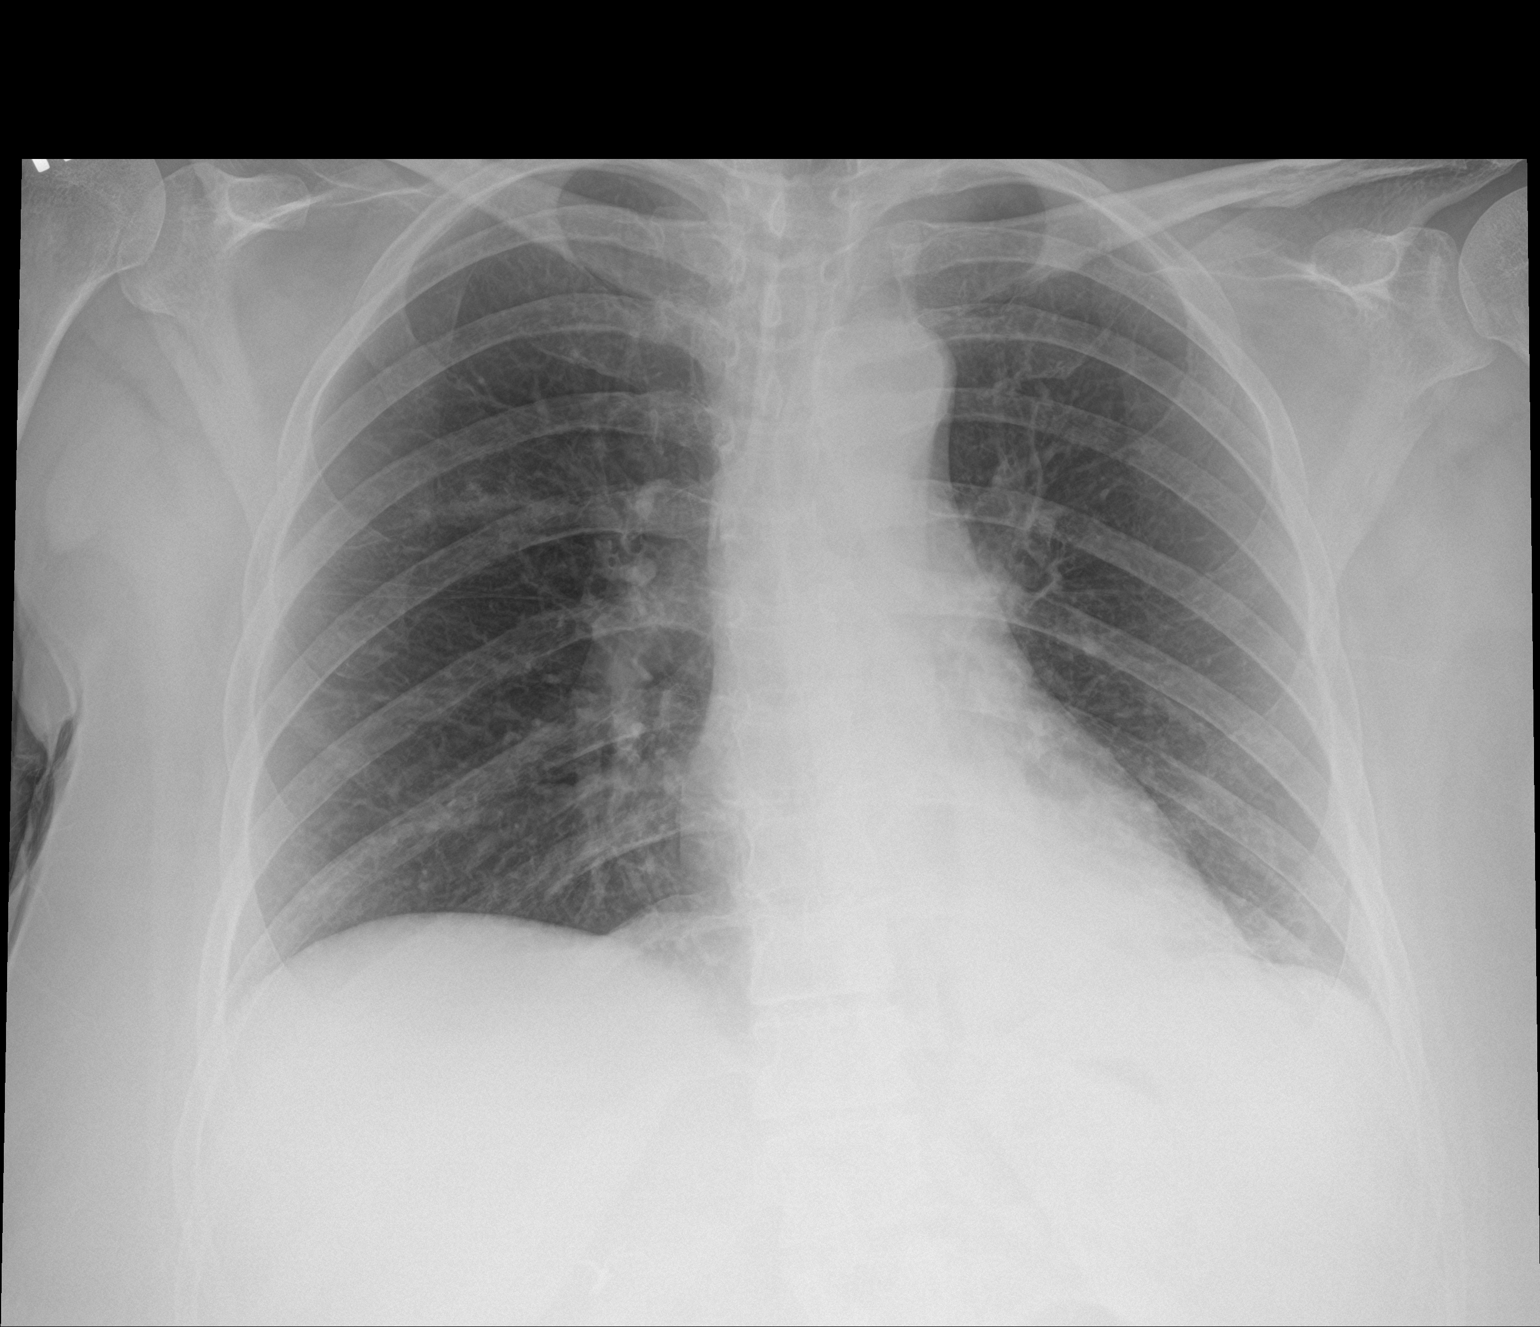

[2 of 2 positions shown; findings below may reference images not displayed]

FINDINGS: There is airspace consolidation in the left base consistent with
pneumonia. Lungs elsewhere are clear. Heart size and pulmonary
vascularity are normal. No adenopathy. No bone lesions.
IMPRESSION: Posterior left base airspace consolidation consistent with
pneumonia. Lungs elsewhere clear. No adenopathy evident.

Followup PA and lateral chest radiographs recommended in 3-4 weeks
following trial of antibiotic therapy to ensure resolution and
exclude underlying malignancy.

## 2019-01-27 ENCOUNTER — Emergency Department (HOSPITAL_COMMUNITY)
Admission: EM | Admit: 2019-01-27 | Discharge: 2019-01-27 | Disposition: A | Discharge status: Home / Self Care | Payer: Medicaid Other | Attending: Emergency Medicine | Admitting: Emergency Medicine

## 2019-01-27 ENCOUNTER — Encounter (HOSPITAL_COMMUNITY): Payer: Self-pay | Admitting: Emergency Medicine

## 2019-01-27 ENCOUNTER — Other Ambulatory Visit: Payer: Self-pay

## 2019-01-27 DIAGNOSIS — Z87891 Personal history of nicotine dependence: Secondary | ICD-10-CM | POA: Diagnosis not present

## 2019-01-27 DIAGNOSIS — R300 Dysuria: Secondary | ICD-10-CM | POA: Diagnosis present

## 2019-01-27 DIAGNOSIS — Z79899 Other long term (current) drug therapy: Secondary | ICD-10-CM | POA: Diagnosis not present

## 2019-01-27 DIAGNOSIS — Z7902 Long term (current) use of antithrombotics/antiplatelets: Secondary | ICD-10-CM | POA: Diagnosis not present

## 2019-01-27 DIAGNOSIS — B379 Candidiasis, unspecified: Secondary | ICD-10-CM

## 2019-01-27 LAB — CBC WITH DIFFERENTIAL/PLATELET
ABS IMMATURE GRANULOCYTES: 0.01 10*3/uL (ref 0.00–0.07)
BASOS PCT: 1 %
Basophils Absolute: 0.1 10*3/uL (ref 0.0–0.1)
EOS ABS: 0.1 10*3/uL (ref 0.0–0.5)
Eosinophils Relative: 1 %
HCT: 44.7 % (ref 36.0–46.0)
Hemoglobin: 13.8 g/dL (ref 12.0–15.0)
IMMATURE GRANULOCYTES: 0 %
Lymphocytes Relative: 24 %
Lymphs Abs: 2 10*3/uL (ref 0.7–4.0)
MCH: 28.5 pg (ref 26.0–34.0)
MCHC: 30.9 g/dL (ref 30.0–36.0)
MCV: 92.4 fL (ref 80.0–100.0)
MONOS PCT: 6 %
Monocytes Absolute: 0.5 10*3/uL (ref 0.1–1.0)
NEUTROS ABS: 5.5 10*3/uL (ref 1.7–7.7)
NEUTROS PCT: 68 %
PLATELETS: 351 10*3/uL (ref 150–400)
RBC: 4.84 MIL/uL (ref 3.87–5.11)
RDW: 12.2 % (ref 11.5–15.5)
WBC: 8.1 10*3/uL (ref 4.0–10.5)
nRBC: 0 % (ref 0.0–0.2)

## 2019-01-27 LAB — URINALYSIS, ROUTINE W REFLEX MICROSCOPIC
BACTERIA UA: NONE SEEN
Bilirubin Urine: NEGATIVE
Glucose, UA: NEGATIVE mg/dL
Hgb urine dipstick: NEGATIVE
KETONES UR: NEGATIVE mg/dL
NITRITE: NEGATIVE
PROTEIN: NEGATIVE mg/dL
Specific Gravity, Urine: 1.02 (ref 1.005–1.030)
pH: 5 (ref 5.0–8.0)

## 2019-01-27 LAB — BASIC METABOLIC PANEL
ANION GAP: 8 (ref 5–15)
BUN: 14 mg/dL (ref 6–20)
CALCIUM: 9 mg/dL (ref 8.9–10.3)
CO2: 26 mmol/L (ref 22–32)
Chloride: 105 mmol/L (ref 98–111)
Creatinine, Ser: 0.69 mg/dL (ref 0.44–1.00)
GFR calc Af Amer: >60 mL/min (ref >60)
GLUCOSE: 96 mg/dL (ref 70–99)
POTASSIUM: 3.5 mmol/L (ref 3.5–5.1)
SODIUM: 139 mmol/L (ref 135–145)

## 2019-01-27 MED ORDER — FLUCONAZOLE 150 MG PO TABS: 150.0000 mg | ORAL_TABLET | Freq: Once | ORAL | 0 refills | Status: AC

## 2019-01-27 MED ORDER — SODIUM CHLORIDE 0.9 % IV BOLUS
1000.0000 mL | Freq: Once | INTRAVENOUS | Status: AC
  Administered 2019-01-27: 1000 mL via INTRAVENOUS

## 2019-01-27 NOTE — ED Notes (Addendum)
This nurse spoke with pt family member for 10-15 minutes. Listened to pt family express frustration regarding pt status and findings. Family insists that something is wrong with pt because her urine has a foul smell.  Reviewed pt lab results and treatment provided including fluids given here. Family continues complaining of stress of care giving and her bad health issues. This nurse listened and expressed empathy. Offered to give refer rel from Dr Effie Shy to [facility] instead of [place] location, this seemed to satisfy family somewhat. Family member using curse words during conversation and has been rude to myself and other staff members during the time here.

## 2019-01-27 NOTE — ED Triage Notes (Signed)
Pt comes in with family stating they think pt is dehydrated. For 4-5 days she has had minimal urinary output in depend. Per family pt told family member she is having dysuria.

## 2019-01-27 NOTE — Discharge Instructions (Signed)
Try to drink 1 L of water every day.  Follow-up with your primary care doctor and call the urologist for a follow-up appointment.

## 2019-01-27 NOTE — ED Provider Notes (Signed)
[facility] EMERGENCY DEPARTMENT Provider Note   CSN: [id number] Arrival date & time: 01/27/19  1743    History   Chief Complaint Chief Complaint  Patient presents with  . Dysuria    HPI Sharon Stevenson is a 58 y.o. female.     HPI   Patient is here for evaluation of "foul-smelling urine."  Also, her son is worried that she is dehydrated because she has not drinking a lot.  She eats well but will not drink fluids unless she is reminded.  Patient is wheelchair-bound secondary to a stroke, 11 years ago.  There is been no recent change in her chronic illness.  There is been no recent fever, cough, change in behavior.  History is from patient's son who is here with her.  There are no other known modifying factors.  Past Medical History:  Diagnosis Date  . Menorrhagia with regular cycle 2014   Ablation endometrial, 01/02/2013  . Seizures (HCC)    seizures happened with her stroke, none since  . Stroke [facility]) 2008    Patient Active Problem List   Diagnosis Date Noted  . Emesis, persistent 03/27/2018  . Dehydration 03/27/2018  . Confusion 03/27/2018  . Menorrhagia with regular cycle 01/02/2013    Past Surgical History:  Procedure Laterality Date  . CESAREAN SECTION    . DILITATION & CURRETTAGE/HYSTROSCOPY WITH THERMACHOICE ABLATION  01/02/2013   Procedure: DILATATION & CURETTAGE/HYSTEROSCOPY WITH THERMACHOICE ABLATION;  Surgeon: [name], MD;  Location: [facility];  Service: Gynecology;  Laterality: N/A;  Total Ablation Time = 12 minutes 36 seconds     ; 86-88 deg C  . TUBAL LIGATION       OB History   No obstetric history on file.      Home Medications    Prior to Admission medications   Medication Sig Start Date End Date Taking? Authorizing Provider  clopidogrel (PLAVIX) 75 MG tablet Take 75 mg by mouth every morning.    Yes [provider]  Dextromethorphan-Quinidine (NUEDEXTA) 20-10 MG CAPS Take 1 capsule by mouth at bedtime.   Yes [provider]  diazepam (VALIUM) 2 MG tablet Take 2 mg by mouth every 6 (six) hours as needed for anxiety.   Yes [provider]  DULoxetine (CYMBALTA) 60 MG capsule Take 60 mg by mouth at bedtime.    Yes [provider]  levETIRAcetam (KEPPRA) 500 MG tablet Take 500 mg by mouth 2 (two) times daily.   Yes [provider]  pantoprazole (PROTONIX) 40 MG tablet Take 40 mg by mouth daily. 12/08/17  Yes [provider]  phenytoin (DILANTIN) 50 MG tablet Chew 150 mg by mouth 2 (two) times daily.    Yes [provider]  QUEtiapine (SEROQUEL) 400 MG tablet Take 400 mg by mouth at bedtime.   Yes [provider]  simvastatin (ZOCOR) 20 MG tablet Take 20 mg by mouth every morning.    Yes [provider]  fluconazole (DIFLUCAN) 150 MG tablet Take 1 tablet (150 mg total) by mouth once for 1 dose. 01/27/19 01/27/19  Mancel Bale, MD    Family History History reviewed. No pertinent family history.  Social History Social History   Tobacco Use  . Smoking status: Former Games developer  . Smokeless tobacco: Former Neurosurgeon    Types: Chew  Substance Use Topics  . Alcohol use: No  . Drug use: No     Allergies   Patient has no known allergies.   Review  of Systems Review of Systems  All other systems reviewed and are negative.    Physical Exam Updated Vital Signs BP 129/80   Pulse 74   Temp 97.9 F (36.6 C) (Oral)   Resp 20   Wt 102.1 kg   SpO2 100%   BMI 30.52 kg/m   Physical Exam Vitals signs and nursing note reviewed.  Constitutional:      General: She is not in acute distress.    Appearance: She is well-developed. She is not ill-appearing, toxic-appearing or diaphoretic.  HENT:     Head: Normocephalic and atraumatic.     Right Ear: External ear normal.     Left Ear: External ear normal.  Eyes:     Conjunctiva/sclera: Conjunctivae normal.     Pupils: Pupils are equal, round, and reactive to light.  Neck:     Musculoskeletal: Normal range  of motion and neck supple.     Trachea: Phonation normal.  Cardiovascular:     Rate and Rhythm: Normal rate and regular rhythm.     Heart sounds: Normal heart sounds.  Pulmonary:     Effort: Pulmonary effort is normal.     Breath sounds: Normal breath sounds.  Abdominal:     Palpations: Abdomen is soft.     Tenderness: There is no abdominal tenderness.  Musculoskeletal: Normal range of motion.  Skin:    General: Skin is warm and dry.  Neurological:     Mental Status: She is alert.     Motor: No abnormal muscle tone.     Comments: Left hemiplegia.  Dysarthria is present.  Psychiatric:        Mood and Affect: Mood normal.        Behavior: Behavior normal.      ED Treatments / Results  Labs (all labs ordered are listed, but only abnormal results are displayed) Labs Reviewed  URINALYSIS, ROUTINE W REFLEX MICROSCOPIC - Abnormal; Notable for the following components:      Result Value   Color, Urine AMBER (*)    APPearance HAZY (*)    Leukocytes,Ua TRACE (*)    All other components within normal limits  CBC WITH DIFFERENTIAL/PLATELET  BASIC METABOLIC PANEL    EKG None  Radiology No results found.  Procedures Procedures (including critical care time)  Medications Ordered in ED Medications  sodium chloride 0.9 % bolus 1,000 mL (1,000 mLs Intravenous New Bag/Given 01/27/19 1858)     Initial Impression / Assessment and Plan / ED Course  I have reviewed the triage vital signs and the nursing notes.  Pertinent labs & imaging results that were available during my care of the patient were reviewed by me and considered in my medical decision making (see chart for details).  Clinical Course as of Jan 27 2030  Sat Jan 27, 2019  2005 Normal  CBC with Differential [EW]  2005 Normal  Basic metabolic panel [EW]  2005 Normal except white count high, mucus and yeast present  Urinalysis, Routine w reflex microscopic- may I&O cath if menses(!) [EW]    Clinical Course User  Index [EW] Mancel Bale, MD        Patient Vitals for the past 24 hrs:  BP Temp Temp src Pulse Resp SpO2 Weight  01/27/19 1900 129/80 - - 74 - 100 % -  01/27/19 1749 118/70 97.9 F (36.6 C) Oral 80 20 100 % 102.1 kg    8:26 PM Reevaluation with update and discussion. After initial assessment and treatment, an updated  evaluation reveals patient is comfortable.  She is alert and interactive.  A different family member is with the patient now.  I explained the findings to the family member and she became upset and began disparaging the patient's primary care provider.  With female person is concerned that the patient has had an odor from her perineum for months and the PCP will not refer her to a urology doctor.  I encouraged the female person with the patient to help the patient get a different primary care doctor and she said that she would be trying to do that.  The female person with the patient requested referral to a urologist.  I explained that I will do that but am not sure that they would be able to see the patient without the assistance of a primary care provider.  All questions were answered. Mancel Bale   Medical Decision Making: Nonspecific complaints with decreased fluid intake.  Malodorous urine with yeast on urinalysis.  Unclear if this is an infectious process.  Will give trial treatment with Diflucan, x1.  Doubt sepsis, metabolic instability or impending vascular collapse.  CRITICAL CARE-no Performed by: Mancel Bale   Nursing Notes Reviewed/ Care Coordinated Applicable Imaging Reviewed Interpretation of Laboratory Data incorporated into ED treatment  The patient appears reasonably screened and/or stabilized for discharge and I doubt any other medical condition or other [facility] requiring further screening, evaluation, or treatment in the ED at this time prior to discharge.  Plan: Home Medications-continue usual; Home Treatments-rest, fluids; return here if the recommended  treatment, does not improve the symptoms; Recommended follow up-PCP, PRN     Final Clinical Impressions(s) / ED Diagnoses   Final diagnoses:  Yeast infection    ED Discharge Orders         Ordered    fluconazole (DIFLUCAN) 150 MG tablet   Once     01/27/19 2030           Mancel Bale, MD 01/27/19 2031

## 2019-01-29 NOTE — ED Notes (Signed)
Family called and reports they did not receive the printed prescription for diflucan.  Discussed with Dr. Deretha Emory and ok to call in to [facility] as requested by family.

## 2019-05-09 ENCOUNTER — Ambulatory Visit: Payer: Medicaid Other | Admitting: Urology

## 2021-07-31 ENCOUNTER — Encounter (HOSPITAL_COMMUNITY): Payer: Self-pay

## 2021-07-31 ENCOUNTER — Other Ambulatory Visit: Payer: Self-pay

## 2021-07-31 ENCOUNTER — Emergency Department (HOSPITAL_COMMUNITY)
Admission: EM | Admit: 2021-07-31 | Discharge: 2021-08-01 | Disposition: A | Discharge status: Home / Self Care | Payer: Medicaid Other | Attending: Emergency Medicine | Admitting: Emergency Medicine

## 2021-07-31 DIAGNOSIS — K5641 Fecal impaction: Secondary | ICD-10-CM | POA: Diagnosis not present

## 2021-07-31 DIAGNOSIS — Z87891 Personal history of nicotine dependence: Secondary | ICD-10-CM | POA: Insufficient documentation

## 2021-07-31 DIAGNOSIS — R140 Abdominal distension (gaseous): Secondary | ICD-10-CM | POA: Diagnosis present

## 2021-07-31 NOTE — ED Triage Notes (Signed)
Pt arrived via POV with son. Son states that the pt has not had a good BM since 08/14. Pt's son states that today she expelled 2 small balls. Pt was on linzess daily, but family stopped giving it to her d/t her having loose bms.

## 2021-08-01 MED ORDER — SENNOSIDES-DOCUSATE SODIUM 8.6-50 MG PO TABS: 1.0000 | ORAL_TABLET | Freq: Every day | ORAL | 1 refills | Status: DC

## 2021-08-01 NOTE — Discharge Instructions (Addendum)
You were evaluated in the Emergency Department and after careful evaluation, we did not find any emergent condition requiring admission or further testing in the hospital.  Your exam/testing today is overall reassuring.  Symptoms due to a fecal impaction.  We did a disimpaction here in the emergency department.  Important that you continue to use stool softeners to prevent this from happening again.  Take the Senokot medicine daily to help keep your stools soft.  Also recommend MiraLAX up to 6 times daily until you achieve soft stools, at which point you can start taking less.  Please return to the Emergency Department if you experience any worsening of your condition.   Thank you for allowing [place] to be a part of your care.

## 2021-08-01 NOTE — ED Provider Notes (Signed)
[facility] Emergency Department Provider Note MRN:  [id number]  Arrival date & time: 08/01/21     Chief Complaint   Constipation   History of Present Illness   Sharon Stevenson is a 60 y.o. year-old female with a history of seizure disorder presenting to the ED with chief complaint of constipation.  Location: Bowels Duration: 2 weeks Onset: Gradual Timing: Constant Description: Has not had a "good" bowel movement for 2 weeks Severity: Mild to moderate Exacerbating/Alleviating Factors: None Associated Symptoms: Abdominal bloating Pertinent Negatives: Denies fever, no nausea vomiting, no chest pain  Additional History: None  Review of Systems  A complete 10 system review of systems was obtained and all systems are negative except as noted in the HPI and PMH.   Patient's Health History    Past Medical History:  Diagnosis Date   Menorrhagia with regular cycle 2014   Ablation endometrial, 01/02/2013   Seizures (HCC)    seizures happened with her stroke, none since   Stroke [facility]) 2008    Past Surgical History:  Procedure Laterality Date   CESAREAN SECTION     DILITATION & CURRETTAGE/HYSTROSCOPY WITH THERMACHOICE ABLATION  01/02/2013   Procedure: DILATATION & CURETTAGE/HYSTEROSCOPY WITH THERMACHOICE ABLATION;  Surgeon: [name], MD;  Location: [facility] OR[facility];  Service: Gynecology;  Laterality: N/A;  Total Ablation Time = 12 minutes 36 seconds     ; 86-88 deg C   TUBAL LIGATION      History reviewed. No pertinent family history.  Social History   Socioeconomic History   Marital status: Single    Spouse name: Not on file   Number of children: Not on file   Years of education: Not on file   Highest education level: Not on file  Occupational History   Not on file  Tobacco Use   Smoking status: Former   Smokeless tobacco: Former    Types: Chew  Substance and Sexual Activity   Alcohol use: No   Drug use: No   Sexual activity: Never    Birth  control/protection: Surgical  Other Topics Concern   Not on file  Social History Narrative   Not on file   Social Determinants of Health   Financial Resource Strain: Not on file  Food Insecurity: Not on file  Transportation Needs: Not on file  Physical Activity: Not on file  Stress: Not on file  Social Connections: Not on file  Intimate Partner Violence: Not on file     Physical Exam   Vitals:   07/31/21 2347  BP: 134/81  Pulse: 72  Resp: 16  Temp: 98.4 F (36.9 C)  SpO2: 100%    CONSTITUTIONAL: Well-appearing, NAD NEURO:  Alert and oriented x 3, normal and symmetric strength and sensation, dense speech impediment EYES:  eyes equal and reactive ENT/NECK:  no LAD, no JVD CARDIO: Regular rate, well-perfused, normal S1 and S2 PULM:  CTAB no wheezing or rhonchi GI/GU:  normal bowel sounds, non-distended, non-tender MSK/SPINE:  No gross deformities, no edema SKIN:  no rash, atraumatic PSYCH:  Appropriate speech and behavior  *Additional and/or pertinent findings included in MDM below  Diagnostic and Interventional Summary    EKG Interpretation  Date/Time:    Ventricular Rate:    PR Interval:    QRS Duration:   QT Interval:    QTC Calculation:   R Axis:     Text Interpretation:         Labs Reviewed - No data to display  No orders to display    Medications - No data to display   Procedures  /  Critical Care Fecal disimpaction  Date/Time: 08/01/2021 2:01 AM Performed by: Sabas Sous, MD Authorized by: Sabas Sous, MD  Consent: Verbal consent obtained. Consent given by: patient Patient understanding: patient states understanding of the procedure being performed Imaging studies: imaging studies available Patient identity confirmed: verbally with patient Patient tolerance: patient tolerated the procedure well with no immediate complications Comments: Large amount of solid stool removed from the rectal vault    ED Course and Medical Decision  Making  I have reviewed the triage vital signs, the nursing notes, and pertinent available records from the EMR.  Listed above are laboratory and imaging tests that I personally ordered, reviewed, and interpreted and then considered in my medical decision making (see below for details).  Suspect constipation or fecal impaction.  Abdomen soft and nontender, normal vital signs, doubt emergent process.  We will proceed with disimpaction attempt.     Disimpaction is successful, abdominal exam is again reassuring, normal vital signs, patient is appropriate for discharge with stool softeners, return precautions.  Elmer Sow. Pilar Plate, MD [facility] Emergency Medicine [facility] [email or web address]  Final Clinical Impressions(s) / ED Diagnoses     ICD-10-CM   1. Fecal impaction in rectum [facility])  K56.41       ED Discharge Orders          Ordered    senna-docusate (SENOKOT-S) 8.6-50 MG tablet  Daily at bedtime        08/01/21 0200             Discharge Instructions Discussed with and Provided to Patient:     Discharge Instructions      You were evaluated in the Emergency Department and after careful evaluation, we did not find any emergent condition requiring admission or further testing in the hospital.  Your exam/testing today is overall reassuring.  Symptoms due to a fecal impaction.  We did a disimpaction here in the emergency department.  Important that you continue to use stool softeners to prevent this from happening again.  Take the Senokot medicine daily to help keep your stools soft.  Also recommend MiraLAX up to 6 times daily until you achieve soft stools, at which point you can start taking less.  Please return to the Emergency Department if you experience any worsening of your condition.   Thank you for allowing [place] to be a part of your care.        Sabas Sous, MD 08/01/21 [phone]

## 2021-08-28 ENCOUNTER — Ambulatory Visit
Admission: EM | Admit: 2021-08-28 | Discharge: 2021-08-28 | Disposition: A | Discharge status: Home / Self Care | Payer: Medicaid Other | Attending: Emergency Medicine | Admitting: Emergency Medicine

## 2021-08-28 ENCOUNTER — Other Ambulatory Visit: Payer: Self-pay

## 2021-08-28 DIAGNOSIS — N3001 Acute cystitis with hematuria: Secondary | ICD-10-CM | POA: Diagnosis present

## 2021-08-28 DIAGNOSIS — Z760 Encounter for issue of repeat prescription: Secondary | ICD-10-CM | POA: Diagnosis present

## 2021-08-28 DIAGNOSIS — F411 Generalized anxiety disorder: Secondary | ICD-10-CM

## 2021-08-28 DIAGNOSIS — R8290 Unspecified abnormal findings in urine: Secondary | ICD-10-CM | POA: Diagnosis present

## 2021-08-28 DIAGNOSIS — R300 Dysuria: Secondary | ICD-10-CM | POA: Insufficient documentation

## 2021-08-28 LAB — POCT URINALYSIS DIP (MANUAL ENTRY)
Bilirubin, UA: NEGATIVE
Glucose, UA: 100 mg/dL — AB
Ketones, POC UA: NEGATIVE mg/dL
Nitrite, UA: POSITIVE — AB
Spec Grav, UA: 1.01 (ref 1.010–1.025)
Urobilinogen, UA: 1 E.U./dL
pH, UA: 5.5 (ref 5.0–8.0)

## 2021-08-28 MED ORDER — LORAZEPAM 0.5 MG PO TABS: 0.5000 mg | ORAL_TABLET | Freq: Three times a day (TID) | ORAL | 0 refills | Status: DC | PRN

## 2021-08-28 MED ORDER — CEPHALEXIN 500 MG PO CAPS: 500.0000 mg | ORAL_CAPSULE | Freq: Two times a day (BID) | ORAL | 0 refills | Status: AC

## 2021-08-28 NOTE — ED Triage Notes (Signed)
Pt brought in with c/o dark colored urine with odor

## 2021-08-28 NOTE — ED Provider Notes (Signed)
[facility]   CC: concern for UTI; medication refill  SUBJECTIVE: HPI obtained from family and CG Maridee A Dulak is a 60 y.o. female who complains of foul smelling urine and dark urine x few days.  Had a seizure and is wheelchair bound mostly.  Wears depends.  Denies pain.  Denies alleviating or aggravating factors.  Admits to similar symptoms in the past.  Denies fever, chills, nausea, vomiting, abdominal pain, flank pain.  Patient also requests ativan refill.  Takes for anxiety.  Family and CG report angry outburst, when patient does not have medication.  PCP recently closed, so unable to get in with them.  Has appt with Dr. Allena Katz in November.    LMP: No LMP recorded. Patient has had an ablation.  ROS: As in HPI.  All other pertinent ROS negative.     Past Medical History:  Diagnosis Date   Menorrhagia with regular cycle 2014   Ablation endometrial, 01/02/2013   Seizures (HCC)    seizures happened with her stroke, none since   Stroke [facility] 2008   Past Surgical History:  Procedure Laterality Date   CESAREAN SECTION     DILITATION & CURRETTAGE/HYSTROSCOPY WITH THERMACHOICE ABLATION  01/02/2013   Procedure: DILATATION & CURETTAGE/HYSTEROSCOPY WITH THERMACHOICE ABLATION;  Surgeon: [name], MD;  Location: [facility];  Service: Gynecology;  Laterality: N/A;  Total Ablation Time = 12 minutes 36 seconds     ; 86-88 deg C   TUBAL LIGATION     Allergies  Allergen Reactions   Codeine Nausea And Vomiting   No current facility-administered medications on file prior to encounter.   Current Outpatient Medications on File Prior to Encounter  Medication Sig Dispense Refill   clopidogrel (PLAVIX) 75 MG tablet Take 75 mg by mouth every morning.      Dextromethorphan-Quinidine (NUEDEXTA) 20-10 MG CAPS Take 1 capsule by mouth at bedtime.     DULoxetine (CYMBALTA) 60 MG capsule Take 60 mg by mouth at bedtime.      levETIRAcetam (KEPPRA) 500 MG tablet Take 500 mg by mouth 2 (two)  times daily.     LORazepam (ATIVAN) 0.5 MG tablet Take 0.5 mg by mouth 3 (three) times daily.     pantoprazole (PROTONIX) 40 MG tablet Take 40 mg by mouth daily.  6   phenytoin (DILANTIN) 50 MG tablet Chew 150 mg by mouth 2 (two) times daily.      QUEtiapine (SEROQUEL) 400 MG tablet Take 400 mg by mouth at bedtime.     senna-docusate (SENOKOT-S) 8.6-50 MG tablet Take 1 tablet by mouth at bedtime. 30 tablet 1   simvastatin (ZOCOR) 20 MG tablet Take 20 mg by mouth every morning.      Social History   Socioeconomic History   Marital status: Single    Spouse name: Not on file   Number of children: Not on file   Years of education: Not on file   Highest education level: Not on file  Occupational History   Not on file  Tobacco Use   Smoking status: Former   Smokeless tobacco: Former    Types: Chew  Substance and Sexual Activity   Alcohol use: No   Drug use: No   Sexual activity: Never    Birth control/protection: Surgical  Other Topics Concern   Not on file  Social History Narrative   Not on file   Social Determinants of Health   Financial Resource Strain: Not on file  Food Insecurity: Not on  file  Transportation Needs: Not on file  Physical Activity: Not on file  Stress: Not on file  Social Connections: Not on file  Intimate Partner Violence: Not on file   History reviewed. No pertinent family history.  OBJECTIVE:  Vitals:   08/28/21 0959  BP: 102/71  Pulse: 78  Resp: 20  Temp: 98.3 F (36.8 C)  SpO2: 96%   General appearance: Alert in no acute distress HEENT: NCAT.   Lungs: clear to auscultation bilaterally without adventitious breath sounds Heart: regular rate and rhythm.   Abdomen: soft; non-distended; no tenderness; bowel sounds present; no guarding Extremities: no edema; symmetrical with no gross deformities Skin: warm and dry Neurologic: Sitting in wheelchair Psychological: alert and cooperative; normal mood and affect  Labs Reviewed  POCT URINALYSIS  DIP (MANUAL ENTRY) - Abnormal; Notable for the following components:      Result Value   Color, UA orange (*)    Clarity, UA cloudy (*)    Glucose, UA =100 (*)    Blood, UA moderate (*)    Protein Ur, POC trace (*)    Nitrite, UA Positive (*)    Leukocytes, UA Large (3+) (*)    All other components within normal limits  URINE CULTURE    ASSESSMENT & PLAN:  1. Dysuria   2. Foul smelling urine   3. Acute cystitis with hematuria   4. Medication refill   5. Anxiety state     Meds ordered this encounter  Medications   cephALEXin (KEFLEX) 500 MG capsule    Sig: Take 1 capsule (500 mg total) by mouth 2 (two) times daily for 10 days.    Dispense:  20 capsule    Refill:  0    Order Specific Question:   Supervising Provider    Answer:   Eustace Moore [[id number]]   LORazepam (ATIVAN) 0.5 MG tablet    Sig: Take 1 tablet (0.5 mg total) by mouth 3 (three) times daily as needed for anxiety.    Dispense:  30 tablet    Refill:  0    Order Specific Question:   Supervising Provider    Answer:   Eustace Moore [[id number]]   Urine concerning for infection Urine culture sent.  We will call you with the results.   Push fluids and get plenty of rest.   Take antibiotic as directed and to completion Follow up with PCP if symptoms persists Return here or go to ER if you have any new or worsening symptoms such as fever, worsening abdominal pain, nausea/vomiting, flank pain, etc...  Ativan refilled.  Short course to bridge care If you need additional refills you will need to follow up with mental health urgent care or Dr. Jill Alexanders office  Outlined signs and symptoms indicating need for more acute intervention. Patient verbalized understanding. After Visit Summary given.      Rennis Harding, PA-C 08/28/21 1025

## 2021-08-28 NOTE — ED Triage Notes (Signed)
Pts PCP closed and needs refill for ativan, unable to get in with new PCP until November

## 2021-08-28 NOTE — Discharge Instructions (Signed)
Urine concerning for infection Urine culture sent.  We will call you with the results.   Push fluids and get plenty of rest.   Take antibiotic as directed and to completion Follow up with PCP if symptoms persists Return here or go to ER if you have any new or worsening symptoms such as fever, worsening abdominal pain, nausea/vomiting, flank pain, etc...  Ativan refilled.  Short course to bridge care If you need additional refills you will need to follow up with mental health urgent care or Dr. Jill Alexanders office

## 2021-08-31 LAB — URINE CULTURE: Culture: >=100000 — AB

## 2021-09-22 ENCOUNTER — Ambulatory Visit (HOSPITAL_COMMUNITY)
Admission: EM | Admit: 2021-09-22 | Discharge: 2021-09-22 | Disposition: A | Discharge status: Home / Self Care | Payer: No Typology Code available for payment source | Attending: Psychiatry | Admitting: Psychiatry

## 2021-09-22 ENCOUNTER — Other Ambulatory Visit: Payer: Self-pay

## 2021-09-22 DIAGNOSIS — R4781 Slurred speech: Secondary | ICD-10-CM | POA: Insufficient documentation

## 2021-09-22 DIAGNOSIS — F411 Generalized anxiety disorder: Secondary | ICD-10-CM

## 2021-09-22 DIAGNOSIS — Z8673 Personal history of transient ischemic attack (TIA), and cerebral infarction without residual deficits: Secondary | ICD-10-CM | POA: Insufficient documentation

## 2021-09-22 MED ORDER — HYDROXYZINE PAMOATE 50 MG PO CAPS: 50.0000 mg | ORAL_CAPSULE | Freq: Three times a day (TID) | ORAL | 0 refills | Status: AC | PRN

## 2021-09-22 NOTE — ED Provider Notes (Signed)
Behavioral Health Urgent Care Medical Screening Exam  Patient Name: Sharon Stevenson MRN: [id number] Date of Evaluation: 09/22/21 Chief Complaint:   Diagnosis:  Final diagnoses:  None    History of Present illness: Sharon Stevenson is a 60 y.o. female. Patient presented to [facility] as a walk in  accompanied by her son with complaints of "ran out of medication". Sharon Stevenson, 60 y.o., female patient seen face to face by this provider, consulted with Dr. Lucianne Muss; and chart reviewed on 09/22/21.  On evaluation Sharon Stevenson's son reports that patient "ran out of her Ativan a couple of days ago". Patient's son stated that he has to go to work tonight and his mother will be with his wife. Patient's son stated that his mother becomes belligerent and experiences anxiety when she does not have her Ativan. During evaluation Sharon Stevenson is sitting position in no acute distress.  She is alert/oriented x 4; calm/cooperative; and mood congruent with affect.  She is speaking with slurred speech due to CVA history at moderate volume, and normal pace; with good eye contact.  Her thought process is coherent and relevant; There is no indication that she is currently responding to internal/external stimuli or experiencing delusional thought content; and she has denied suicidal/self-harm/homicidal ideation, psychosis, and paranoia.   Patient has remained calm throughout assessment and has answered questions appropriately.   At this time Sharon Stevenson is educated and verbalizes understanding of mental health resources and other crisis services in the community. She is instructed to call 911 and present to the nearest emergency room should she experience any suicidal/homicidal ideation, auditory/visual/hallucinations, or detrimental worsening of her mental health condition.      Psychiatric Specialty Exam  Presentation  General Appearance:Appropriate for Environment  Eye Contact:Good  Speech:Slurred; Other (comment) (Hx of  CVA)  Speech Volume:Normal  Handedness:Right   Mood and Affect  Mood:Irritable  Affect:Congruent   Thought Process  Thought Processes:Coherent  Descriptions of Associations:Intact  Orientation:Full (Time, Place and Person)  Thought Content:WDL    Hallucinations:None  Ideas of Reference:None  Suicidal Thoughts:No  Homicidal Thoughts:No   Sensorium  Memory:Immediate Good  Judgment:Good  Insight:Good   Executive Functions  Concentration:Good  Attention Span:Good  Recall:Good  Fund of Knowledge:Good  Language:Good   Psychomotor Activity  Psychomotor Activity:Decreased   Assets  Assets:Housing; Social Support   Sleep  Sleep:Good  Number of hours:  No data recorded  No data recorded  Physical Exam: Physical Exam Vitals and nursing note reviewed.  HENT:     Nose: Nose normal.  Pulmonary:     Effort: Pulmonary effort is normal.  Skin:    General: Skin is warm.  Neurological:     Mental Status: She is alert.  Psychiatric:        Attention and Perception: Attention normal.        Mood and Affect: Mood is anxious.        Speech: Speech is slurred.        Behavior: Behavior is cooperative.        Thought Content: Thought content normal. Thought content does not include homicidal or suicidal ideation.        Cognition and Memory: Cognition normal.        Judgment: Judgment normal.   Review of Systems  Constitutional:  Negative for chills and fever.  Respiratory:  Negative for cough.   Cardiovascular:  Negative for chest pain and palpitations.  Psychiatric/Behavioral:  Negative for hallucinations, substance abuse  and suicidal ideas. The patient is nervous/anxious.   All other systems reviewed and are negative. Blood pressure (!) 137/98, pulse 97, temperature 98.5 F (36.9 C), temperature source Oral, resp. rate 16, SpO2 97 %. There is no height or weight on file to calculate BMI.  Musculoskeletal: Strength & Muscle Tone:  decreased Gait & Station: unsteady Patient leans: N/A   [facility] MSE Discharge Disposition for Follow up and Recommendations: Based on my evaluation the patient does not appear to have an emergency medical condition and can be discharged with resources and follow up care in outpatient services for Medication Management. Prescription for Vistaril provided.   Mcneil Sober, NP 09/22/2021, 12:59 PM

## 2021-09-22 NOTE — Progress Notes (Signed)
Patient seen and discharged from facility.  Prescriptions called in to pharmacy.  Patient escorted via wheelchair by her son out of building after collecting valuables in locker.  No distress or complaint and son verbalized understanding of d/c instructions.

## 2021-09-22 NOTE — BH Assessment (Signed)
Pt here with her son/guardian who is asking for a refill for lorazepam 0.5mg . Son reports pt doctor retired so they changed providers however they are unable to meet with the provider until middle of of November. Son reports taking pt to [facility] urgent care and was given a prescription but pt ran out of medications on Sunday and urgent care is not able to refill medications again so they came here to see if pt can get a refill. Son denies concerns about SI, HI, AVH and reports that medications help pt stay calm. Pt is in a wheelchair and speech is somewhat difficult to understand. Pt states "you nice" and reports that "white people" are mean to her.   Pt is routine.

## 2021-09-22 NOTE — Discharge Instructions (Signed)

## 2022-01-06 ENCOUNTER — Other Ambulatory Visit (HOSPITAL_COMMUNITY): Payer: Self-pay | Admitting: Family Medicine

## 2022-01-06 DIAGNOSIS — Z1231 Encounter for screening mammogram for malignant neoplasm of breast: Secondary | ICD-10-CM

## 2022-01-12 ENCOUNTER — Encounter: Payer: Self-pay | Admitting: *Deleted

## 2022-01-18 ENCOUNTER — Ambulatory Visit (HOSPITAL_COMMUNITY): Payer: Medicaid Other

## 2022-02-18 ENCOUNTER — Ambulatory Visit (HOSPITAL_COMMUNITY): Payer: Medicaid Other | Admitting: Physical Therapy

## 2022-03-12 ENCOUNTER — Ambulatory Visit (HOSPITAL_COMMUNITY): Payer: Medicaid Other | Attending: Family Medicine | Admitting: Physical Therapy

## 2022-03-12 ENCOUNTER — Encounter (HOSPITAL_COMMUNITY): Payer: Self-pay | Admitting: Physical Therapy

## 2022-03-12 DIAGNOSIS — R2689 Other abnormalities of gait and mobility: Secondary | ICD-10-CM

## 2022-03-12 DIAGNOSIS — R4587 Impulsiveness: Secondary | ICD-10-CM | POA: Diagnosis present

## 2022-03-12 DIAGNOSIS — R262 Difficulty in walking, not elsewhere classified: Secondary | ICD-10-CM

## 2022-03-12 NOTE — Therapy (Signed)
?OUTPATIENT PHYSICAL THERAPY NEURO EVALUATION ? ? ?Patient Name: Sharon GaskinsDebra A Stevenson ?MRN: [id number] ?DOB:1961-08-27, 61 y.o., female ?Today's Date: 03/12/2022 ? ?PCP: No primary care provider on file. ?REFERRING PROVIDER: Shelby DubinBucio, Elsa C, FNP ? ? PT End of Session - 03/12/22 1313   ? ? Visit Number 1   ? Number of Visits 12   ? Date for PT Re-Evaluation 04/30/22   ? Authorization Type Medicaid of [place]   ? PT Start Time 1313   ? PT Stop Time 1345   ? PT Time Calculation (min) 32 min   ? Activity Tolerance Patient tolerated treatment well   ? Behavior During Therapy Impulsive   ? ?  ?  ? ?  ? ? ?Past Medical History:  ?Diagnosis Date  ? Menorrhagia with regular cycle 2014  ? Ablation endometrial, 01/02/2013  ? Seizures (HCC)   ? seizures happened with her stroke, none since  ? Stroke [facility](HCC) 2008  ? ?Past Surgical History:  ?Procedure Laterality Date  ? CESAREAN SECTION    ? DILITATION & CURRETTAGE/HYSTROSCOPY WITH THERMACHOICE ABLATION  01/02/2013  ? Procedure: DILATATION & CURETTAGE/HYSTEROSCOPY WITH THERMACHOICE ABLATION;  Surgeon: Tilda BurrowJohn V Ferguson, MD;  Location: [facility];  Service: Gynecology;  Laterality: N/A;  Total Ablation Time = 12 minutes 36 seconds     ; 86-88 deg C  ? TUBAL LIGATION    ? ?Patient Active Problem List  ? Diagnosis Date Noted  ? Emesis, persistent 03/27/2018  ? Dehydration 03/27/2018  ? Confusion 03/27/2018  ? Menorrhagia with regular cycle 01/02/2013  ? ?REFERRING PROVIDER: Shelby DubinBucio, Elsa C, FNP ? ?ONSET DATE: >15 years ? ?REFERRING DIAG: Muscle wasting and atrophy, not elsewhere classified, unspecified lower leg (M62.569) ? ?THERAPY DIAG:  ?Impulsive ? ?Difficulty in walking, not elsewhere classified ? ?Other abnormalities of gait and mobility ? ?SUBJECTIVE:  ?                                                                                                                                                                                           ? ?SUBJECTIVE STATEMENT: ?(The patient's son's girlfriend was  present during the evaluation and provided the following history as the patient has a limited vocabulary and gives intermittently inaccurate verbal responses). The exact date of the stroke is unknown, but at least 15 years prior to today's evaluation. At home the issue is that the patient prefers to sit for the majority of the time and she is extremely impulsive when deciding to sit. Balance is a concern and there have been a few falls, after which no major injuries occurred. Strength is reportedly well maintained, but it  does not translate to functional tasks in a way that allows the patient to consistently perform self transfers. ? ?Pt accompanied by:  Son's girlfriend ? ?PERTINENT HISTORY: CVA ? ?PAIN:  ?Are you having pain? No ? ?PRECAUTIONS: Fall ? ?WEIGHT BEARING RESTRICTIONS No ? ?FALLS: Has patient fallen in last 6 months? Yes. Number of falls 2-3 ? ?LIVING ENVIRONMENT: ?Lives with: lives with their family ?Lives in: House/apartment ?Stairs: Yes: External: 5 steps; none ?Has following equipment at home: Wheelchair (manual) and Rollator ? ?PLOF: Needs assistance with ADLs, Needs assistance with homemaking, and Needs assistance with gait ? ?PATIENT(caregiver) GOALS: Improved balance in order to reduce the degree of supervision needed for transfers and ambulation. ? ?OBJECTIVE:  ? ?DIAGNOSTIC FINDINGS: No relevant imaging on file at the time of this appointment. ? ?COGNITION: ?Overall cognitive status: Impaired: Commands: Follows one step commands consistently ?Behavior: Restless, Impulsive, Physical agitation, and Poor frustration tolerance and Difficulty to assess ?  ?SENSATION: ?Not tested ? ?MUSCLE TONE: Normal ? ?POSTURE: rounded shoulders, forward head, and increased thoracic kyphosis ? ?LE ROM:    ? ?Active  Right ?03/12/2022 Left ?03/12/2022  ?Hip flexion    ?Hip extension    ?Hip abduction    ?Hip adduction    ?Hip internal rotation    ?Hip external rotation    ?Knee flexion    ?Knee extension    ?Ankle  dorsiflexion    ?Ankle plantarflexion    ?Ankle inversion    ?Ankle eversion    ? (Blank rows = not tested) ? ?MMT:   ? ?MMT Right ?03/12/2022 Left ?03/12/2022  ?Hip flexion 4- 4-  ?Hip extension    ?Hip abduction 4+ 4+  ?Hip adduction 5 5  ?Hip internal rotation    ?Hip external rotation    ?Knee flexion 5 5  ?Knee extension 5 4+  ?Ankle dorsiflexion 5 4  ?Ankle plantarflexion    ?Ankle inversion 5 4-  ?Ankle eversion 5 5  ?(Blank rows = not tested) ? ?BED MOBILITY:  ?Sit to supine Complete Independence ?Supine to sit Complete Independence ?Rolling to Right Complete Independence ?Rolling to Left Complete Independence ? ?TRANSFERS: ?Assistive device utilized: Grab bars  ?Sit to stand: Complete Independence and Modified independence ?Stand to sit: Complete Independence and Modified independence ?Chair to chair: Complete Independence and Modified independence ?Floor: Mod A and Max A ? ?BALANCE: ? Lt SLS(HHA): 6s ? Rt SLS(HHA): 11s ? ?CURB:  ?Level of Assistance: Mod A ?Assistive device utilized:  HHA ? ?STAIRS: ? Level of Assistance: CGA ? Stair Negotiation Technique: Alternating Pattern  with No Rails ? Number of Stairs: 5 ? Height of Stairs: 7in ? ?GAIT: ?Gait pattern: Right hip hike, Left hip hike, ataxic, festinating, wide BOS, poor foot clearance- Right, and poor foot clearance- Left ?Distance walked: 52ft ?Assistive device utilized:  HHA ?Level of assistance: CGA ?Comments: Patient tends toward retropulsion when receiving HHA from the front and does not accurately place her steps as they are frequently irregular. ? ?PATIENT EDUCATION: ?Education details: HEP and POC ?Person educated: Patient and Caregiver ?Education method: Explanation and Handouts ?Education comprehension: verbalized understanding and needs further education ? ? ?HOME EXERCISE PROGRAM: ?Access Code: [email or web address] ?URL: [email or web address] ?Date: 03/12/2022 ?Prepared by: Creig Hines ? ?Exercises ?- Sit to Stand Without Arm Support  - 2-3 x daily  - 7 x weekly - 3 sets - 8 reps ?- Ankle Inversion with Resistance  - 2-3 x daily - 7 x weekly - 4 sets - 12 reps ? ? ? ?  GOALS: ?Goals reviewed with patient? No ? ?LONG TERM GOALS: Target date:  04/30/2022 ? ?Patient will be able to maintain single leg stance independently bilaterally for at least 4 seconds in order to demonstrate improved ability to avoid falls at home and in the community. ?Baseline: Hand hold assist required for single leg stance of any time bilaterally. ?Goal status: INITIAL ? ?2.  Patient will complete 3 consecutive sit to stand transfers independently in order to achieve improved independence while at home and in the community.  ?Baseline: Unable to complete a sit to stand transfer without moderate to minimal assistance.  ?Goal status: INITIAL ? ?3.  Patient will be able to ambulate at least 164ft with rollator use and only supervision from caregiver without a loss of balance. ?Baseline: Hand hold assistance is needed for the patient to ambulate short distances before requiring a seated rest break. ~39ft of ambulation demonstrated in this manner during initial evaluation. ?Goal status: INITIAL ? ?4.  Patient and caregiver(s) will be independent with HEP. ?Baseline: N/A ?Goal status: INITIAL ? ?ASSESSMENT: ? ?CLINICAL IMPRESSION: ?Patient is a 61 y.o. female presenting to physical therapy with c/o difficulty walking and loss of independence of mobility following a CVA >15 years ago. Minor deficits in strength in the LLE and the stroke was reportedly hemorrhagic in nature. Emotional and affect variences are noted. She presents with pain limited deficits in LLE strength, ambulation endurance, postural and balance impairments, and functional mobility with ADL. She is having to modify and restrict ADL as indicated by subjective information and objective measures which is affecting overall participation. Patient will benefit from skilled physical therapy in order to improve function and reduce  impairment.  ? ? ?OBJECTIVE IMPAIRMENTS Abnormal gait, decreased activity tolerance, decreased balance, decreased cognition, decreased endurance, decreased knowledge of use of DME, decreased mobility, difficulty wa

## 2022-03-15 ENCOUNTER — Ambulatory Visit: Payer: Medicaid Other

## 2022-03-24 ENCOUNTER — Encounter (HOSPITAL_COMMUNITY): Payer: Medicaid Other

## 2022-03-24 ENCOUNTER — Telehealth (HOSPITAL_COMMUNITY): Payer: Self-pay

## 2022-03-24 NOTE — Telephone Encounter (Signed)
No show, called and left message concerning missed apt today.  Included next apt date and time with contact information.  Requested a call if unable to make it to next apt or need to reschedule in future.   ? ?Becky Sax, LPTA/CLT; CBIS ?[phone] ? ?

## 2022-04-06 ENCOUNTER — Ambulatory Visit (HOSPITAL_COMMUNITY): Payer: Medicaid Other | Attending: Family Medicine | Admitting: Physical Therapy

## 2022-04-06 DIAGNOSIS — R262 Difficulty in walking, not elsewhere classified: Secondary | ICD-10-CM | POA: Insufficient documentation

## 2022-04-06 DIAGNOSIS — R2689 Other abnormalities of gait and mobility: Secondary | ICD-10-CM | POA: Insufficient documentation

## 2022-04-06 DIAGNOSIS — R4587 Impulsiveness: Secondary | ICD-10-CM | POA: Diagnosis present

## 2022-04-06 NOTE — Therapy (Addendum)
?OUTPATIENT PHYSICAL THERAPY TREATMENT NOTE ? ? ?Patient Name: Sharon Stevenson ?MRN: [id number] ?DOB:1961/09/29, 61 y.o., female ?Today's Date: 04/06/2022 ? ?PCP: none on file ?REFERRING PROVIDER: Bucio, Julian Reil, FNP ? ?END OF SESSION:  ?Visit Number Today's visit  2  ?  Number of Visits 12   ?  Date for PT Re-Evaluation 04/30/22   ?  Authorization Type Medicaid of [place]   ?  PT Start Time 1100   ?  PT Stop Time 1130   ?  PT Time Calculation (min) 30 min   ?  Activity Tolerance Patient tolerated treatment well   ?  Behavior During Therapy Impulsive   ? ? ?Past Medical History:  ?Diagnosis Date  ? Menorrhagia with regular cycle 2014  ? Ablation endometrial, 01/02/2013  ? Seizures (HCC)   ? seizures happened with her stroke, none since  ? Stroke [facility]) 2008  ? ?Past Surgical History:  ?Procedure Laterality Date  ? CESAREAN SECTION    ? DILITATION & CURRETTAGE/HYSTROSCOPY WITH THERMACHOICE ABLATION  01/02/2013  ? Procedure: DILATATION & CURETTAGE/HYSTEROSCOPY WITH THERMACHOICE ABLATION;  Surgeon: [name], MD;  Location: [facility];  Service: Gynecology;  Laterality: N/A;  Total Ablation Time = 12 minutes 36 seconds     ; 86-88 deg C  ? TUBAL LIGATION    ? ?Patient Active Problem List  ? Diagnosis Date Noted  ? Emesis, persistent 03/27/2018  ? Dehydration 03/27/2018  ? Confusion 03/27/2018  ? Menorrhagia with regular cycle 01/02/2013  ? ? ?REFERRING DIAG: Muscle wasting and atrophy, not elsewhere classified, unspecified lower leg (M62.569 ? ?THERAPY DIAG:  ?Impulsive ? ?Difficulty in walking, not elsewhere classified ? ?Other abnormalities of gait and mobility ? ?PERTINENT HISTORY: CVA ? ?PRECAUTIONS: FALL ? ?SUBJECTIVE: No nods head "no" when asked if she's having any pain and then points to head and states "head" for  headache.  Pt brought today by her son and arrived late for therapy.   ? ?PAIN:  ?Are you having pain? No ? ? ?OBJECTIVE: (objective measures completed at initial evaluation unless otherwise dated) ? ?DIAGNOSTIC  FINDINGS: No relevant imaging on file at the time of this appointment. ?  ?COGNITION: ?Overall cognitive status: Impaired: Commands: Follows one step commands consistently ?Behavior: Restless, Impulsive, Physical agitation, and Poor frustration tolerance and Difficulty to assess ?            ?SENSATION: ?Not tested ?  ?MUSCLE TONE: Normal ?  ?POSTURE: rounded shoulders, forward head, and increased thoracic kyphosis ?  ?  ?MMT:   ?  ?MMT Right ?03/12/22 Right ?04/06/22 Left ?03/12/22 Left ?04/06/22  ?Hip flexion 4-  4-   ?Hip extension        ?Hip abduction 4+ 3-* 4+ 3-*  ?Hip adduction 5  5   ?Hip internal rotation        ?Hip external rotation        ?Knee flexion 5  5   ?Knee extension 5  4+   ?Ankle dorsiflexion 5  4   ?Ankle plantarflexion        ?Ankle inversion 5  4-   ?Ankle eversion 5  5   ?(Blank rows = not tested) * tested in sidelying ?  ?BED MOBILITY:  ?Sit to supine Complete Independence 5/2 : cues for correct technique with all mobility ?Supine to sit Complete Independence ?Rolling to Right Complete Independence ?Rolling to Left Complete Independence ?  ?TRANSFERS: ?Assistive device utilized: Grab bars  ?Sit to stand: Complete Independence and  Modified independence   5/2: pulls up on helper rather than pushing up ?Stand to sit: Complete Independence and Modified independence  5/2: No eccentric control when sitting ?Chair to chair: Complete Independence and Modified independence 5/2: Decreased safety due to impulsivities ?Floor: Mod A and Max A ?  ?BALANCE: ?          Lt SLS(HHA): 6s ?          Rt SLS(HHA): 11s ?  ?CURB:  ?Level of Assistance: Mod A ?Assistive device utilized:  HHA ?  ?STAIRS: ?          Level of Assistance: CGA ?          Stair Negotiation Technique: Alternating Pattern  with No Rails ?          Number of Stairs: 5 ?          Height of Stairs: 7in ?  ?GAIT: ?Gait pattern: Right hip hike, Left hip hike, ataxic, festinating, wide BOS, poor foot clearance- Right, and poor foot clearance-  Left ?Distance walked: 73ft ?Assistive device utilized:  HHA ?Level of assistance: CGA ?Comments: Patient tends toward retropulsion when receiving HHA from the front and does not accurately place her steps as they are frequently irregular. ?  ?TODAYS TREATMENT: ?04/06/22: ?Transfer wheelchair to bed with HHA and cues; pt would not push up on chair as pulls up on person assisting, no eccentric control when sitting ?Sit to stands repeated with cues for eccentric control, HHA 5X ?Supine Bridge 2X10 ? SLR 2X10 each with AAROM due to weakness and form/control ?Side lying clams 10X with AAROM ? Hip abduction AAROM 10X each ?  ?PATIENT EDUCATION: ?Education details: HEP and POC ?Person educated: Patient and Caregiver ?Education method: Explanation and Handouts ?Education comprehension: verbalized understanding and needs further education ?  ?  ?HOME EXERCISE PROGRAM: ?Access Code: [email or web address] ?URL: [email or web address] ?Date: 03/12/2022 ?Prepared by: Creig Hines ?  ?Exercises ?- Sit to Stand Without Arm Support  - 2-3 x daily - 7 x weekly - 3 sets - 8 reps ?- Ankle Inversion with Resistance  - 2-3 x daily - 7 x weekly - 4 sets - 12 reps ?  ?  ?  ?GOALS: ?Goals reviewed with patient? YES ?  ?LONG TERM GOALS: Target date:  04/30/2022 ?  ?Patient will be able to maintain single leg stance independently bilaterally for at least 4 seconds in order to demonstrate improved ability to avoid falls at home and in the community. ?Baseline: Hand hold assist required for single leg stance of any time bilaterally. ?Goal status: ONGOING ?  ?2.  Patient will complete 3 consecutive sit to stand transfers independently in order to achieve improved independence while at home and in the community.  ?Baseline: Unable to complete a sit to stand transfer without moderate to minimal assistance.  ?Goal status: ONGOING ?  ?3.  Patient will be able to ambulate at least 135ft with rollator use and only supervision from caregiver without a loss of  balance. ?Baseline: Hand hold assistance is needed for the patient to ambulate short distances before requiring a seated rest break. ~35ft of ambulation demonstrated in this manner during initial evaluation. ?Goal status: ONGOING ?  ?4.  Patient and caregiver(s) will be independent with HEP. ?Baseline: N/A ?Goal status: ONGOING ?  ?ASSESSMENT: ?  ?CLINICAL IMPRESSION: ?Pt returns today after nearly a month since evaluation; son is unable to determine if we were unable to provide more appts or if he failed to make  more appt.  He did state they NS for her last appt. Pt is walking short distances in home, HHA (no AD) and is generally impulsive with all movements.  Continued with LE strengthening in supine and side lying this session.  Re tested abduction in sidelying with more weakness noted as compared to initial evaluation.  Pt requires AAROM from therapist to complete all exercises in correct form and control.  Pt has difficulty keeping knees straight with leg raises and presents more weakness in Lt as compared to Rt LE.  Encouraged son to begin working on having her push up rather than pull with transfers.  Patient will benefit from skilled physical therapy in order to improve function and reduce impairment.  ?  ?  ?OBJECTIVE IMPAIRMENTS Abnormal gait, decreased activity tolerance, decreased balance, decreased cognition, decreased endurance, decreased knowledge of use of DME, decreased mobility, difficulty walking, decreased strength, and decreased safety awareness.  ?  ?ACTIVITY LIMITATIONS cleaning, community activity, driving, meal prep, laundry, medication management, yard work, and shopping.  ?  ?PERSONAL FACTORS Age, Behavior pattern, Fitness, Past/current experiences, and Time since onset of injury/illness/exacerbation are also affecting patient's functional outcome.  ?  ?  ?REHAB POTENTIAL: Fair Time since onset of CVA ?  ?CLINICAL DECISION MAKING: Evolving/moderate complexity ?  ?EVALUATION COMPLEXITY:  Moderate ?  ?PLAN: ?PT FREQUENCY: 2x/week ?  ?PT DURATION: 6 weeks ?  ?PLANNED INTERVENTIONS: Therapeutic exercises, Therapeutic activity, Neuromuscular re-education, Balance training, Gait training, Patient/Fami

## 2022-04-08 ENCOUNTER — Telehealth (HOSPITAL_COMMUNITY): Payer: Self-pay

## 2022-04-08 ENCOUNTER — Ambulatory Visit (HOSPITAL_COMMUNITY): Payer: Medicaid Other

## 2022-04-08 NOTE — Telephone Encounter (Signed)
No show, called son concerning missed apt.  Son stated he has switched his schedule to 2nd shift and unable to make the apts at noon, transfered call to front desk to reschedule apts. ? ?Ihor Austin, LPTA/CLT; CBIS ?[phone] ? ?

## 2022-04-13 ENCOUNTER — Ambulatory Visit (HOSPITAL_COMMUNITY): Payer: Medicaid Other | Admitting: Physical Therapy

## 2022-04-13 ENCOUNTER — Telehealth (HOSPITAL_COMMUNITY): Payer: Self-pay | Admitting: Physical Therapy

## 2022-04-13 ENCOUNTER — Encounter (HOSPITAL_COMMUNITY): Payer: Self-pay | Admitting: Physical Therapy

## 2022-04-13 NOTE — Telephone Encounter (Signed)
Patient no show for appointment. Spoke with patient's son who stated he has changed work schedule and was unable to bring her to appointment. Educated him on attendance policy and that patient would need a new order if wishing to return to PT. Caregiver verbalized understanding.  ? ?12:04 PM, 04/13/22 ?Mearl Latin PT, DPT ?Physical Therapist at [facility] ?[facility] ? ?

## 2022-04-13 NOTE — Therapy (Signed)
[facility] ?[place] ?[address] ?[place], [place], [address] ?Phone: [phone]   Fax:  [phone] ? ?Patient Details  ?Name: Sharon Stevenson ?MRN: [id number] ?Date of Birth: [birth year] ?Referring Provider:  No ref. provider found ? ?Encounter Date: 04/13/2022 ? ? ?PHYSICAL THERAPY DISCHARGE SUMMARY ? ?Visits from Start of Care: 2 ? ?Current functional level related to goals / functional outcomes: ?Unknown as patient has not returned ?  ?Remaining deficits: ?Unknown as patient has not returned ?  ?  ?Education / Equipment: ?HEP  ? ?Patient agrees to discharge. Patient goals were not met. Patient is being discharged due to not returning since the last visit. ? ? ?Mearl Latin, PT ?04/13/2022, 12:06 PM ? ? ?[facility] ?[address] ?[place], [place], [address] ?Phone: [phone]   Fax:  [phone] ?

## 2022-04-15 ENCOUNTER — Ambulatory Visit (HOSPITAL_COMMUNITY): Payer: Medicaid Other | Admitting: Physical Therapy

## 2022-04-20 ENCOUNTER — Ambulatory Visit (HOSPITAL_COMMUNITY): Payer: Medicaid Other | Admitting: Physical Therapy

## 2022-04-22 ENCOUNTER — Ambulatory Visit (HOSPITAL_COMMUNITY): Payer: Medicaid Other

## 2022-04-27 ENCOUNTER — Encounter (HOSPITAL_COMMUNITY): Payer: Medicaid Other | Admitting: Physical Therapy

## 2022-04-29 ENCOUNTER — Encounter (HOSPITAL_COMMUNITY): Payer: Medicaid Other | Admitting: Physical Therapy

## 2022-05-04 ENCOUNTER — Encounter (HOSPITAL_COMMUNITY): Payer: Medicaid Other | Admitting: Physical Therapy

## 2022-05-06 ENCOUNTER — Encounter (HOSPITAL_COMMUNITY): Payer: Medicaid Other | Admitting: Physical Therapy

## 2022-05-10 ENCOUNTER — Ambulatory Visit: Payer: Medicaid Other

## 2022-05-12 ENCOUNTER — Encounter: Payer: Self-pay | Admitting: *Deleted

## 2022-11-09 ENCOUNTER — Encounter: Payer: Self-pay | Admitting: *Deleted

## 2023-06-15 ENCOUNTER — Other Ambulatory Visit: Payer: Self-pay | Admitting: Family Medicine

## 2023-06-15 DIAGNOSIS — Z1231 Encounter for screening mammogram for malignant neoplasm of breast: Secondary | ICD-10-CM

## 2023-06-17 ENCOUNTER — Telehealth: Payer: Self-pay

## 2023-06-17 NOTE — Telephone Encounter (Signed)
Pt son Sharon Stevenson called to get pt scheduled for colonoscopy please return call

## 2023-07-25 ENCOUNTER — Ambulatory Visit
Admission: RE | Admit: 2023-07-25 | Discharge: 2023-07-25 | Disposition: A | Discharge status: Home / Self Care | Payer: Medicaid Other | Source: Ambulatory Visit | Attending: Family Medicine | Admitting: Family Medicine

## 2023-07-25 DIAGNOSIS — Z1231 Encounter for screening mammogram for malignant neoplasm of breast: Secondary | ICD-10-CM | POA: Diagnosis present

## 2023-08-04 ENCOUNTER — Other Ambulatory Visit: Payer: Self-pay | Admitting: Family Medicine

## 2023-08-04 DIAGNOSIS — R928 Other abnormal and inconclusive findings on diagnostic imaging of breast: Secondary | ICD-10-CM

## 2023-08-15 ENCOUNTER — Encounter: Payer: Self-pay | Admitting: Physician Assistant

## 2023-08-15 ENCOUNTER — Inpatient Hospital Stay: Admission: RE | Admit: 2023-08-15 | Payer: Medicaid Other | Source: Ambulatory Visit

## 2023-08-15 ENCOUNTER — Other Ambulatory Visit: Payer: Medicaid Other

## 2023-08-15 ENCOUNTER — Ambulatory Visit (INDEPENDENT_AMBULATORY_CARE_PROVIDER_SITE_OTHER): Payer: Medicaid Other | Admitting: Physician Assistant

## 2023-08-15 VITALS — BP 97/61 | HR 91 | Temp 98.7°F | Ht 67.0 in

## 2023-08-15 DIAGNOSIS — Z8673 Personal history of transient ischemic attack (TIA), and cerebral infarction without residual deficits: Secondary | ICD-10-CM | POA: Diagnosis not present

## 2023-08-15 DIAGNOSIS — Z1211 Encounter for screening for malignant neoplasm of colon: Secondary | ICD-10-CM

## 2023-08-15 DIAGNOSIS — K219 Gastro-esophageal reflux disease without esophagitis: Secondary | ICD-10-CM | POA: Diagnosis not present

## 2023-08-15 DIAGNOSIS — K5904 Chronic idiopathic constipation: Secondary | ICD-10-CM

## 2023-08-15 DIAGNOSIS — K5909 Other constipation: Secondary | ICD-10-CM

## 2023-08-15 MED ORDER — PEG 3350-KCL-NA BICARB-NACL 420 G PO SOLR: 4000.0000 mL | Freq: Once | ORAL | 0 refills | Status: AC

## 2023-08-15 MED ORDER — LINACLOTIDE 290 MCG PO CAPS: 290.0000 ug | ORAL_CAPSULE | Freq: Every day | ORAL | 1 refills | Status: AC

## 2023-08-15 NOTE — Progress Notes (Signed)
Celso Amy, PA-C [address]  [address]  [place], [place] [address]  Main: [phone]  Fax: [phone]   Gastroenterology Consultation  Referring Provider:     Shelby Dubin, FNP Primary Care Physician:  Shelby Dubin, FNP Primary Gastroenterologist:  Celso Amy, PA-C / Dr. Wyline Mood   Reason for Consultation:     Constipation        HPI:   Sharon Stevenson is a 62 y.o. y/o female referred for consultation & management  by Bucio, Julian Reil, FNP at [facility].  Patient had stroke and seizure in 2008.  Her son Thurmond Butts) is her legal guardian.  She lives with her son and daughter-in-law.  She takes Plavix daily.  No aspirin.  Patient cannot verbally communicate.  She is here today with a caregiver (Davina Ups).  Pt. Is Over Due for her first screening colonoscopy due to age.  No previous Colonoscopy.  She Has history of chronic constipation.  She is currently taking Linzess (uncertain dose) and senna.  Currently on Protonix 40 mg daily for GERD.  Bowel habits are unpredictable.  Some weeks she has a bowel movement daily or every other day.  Then she can go many days with no bowel movement.  No visible rectal bleeding or unintentional weight loss.  Last labs 12/2022 showed normal CBC and CMP.  Hemoglobin 13.9.  No anemia.  Patient went to [facility] the ED 07/2021 for severe constipation and was treated for fecal impaction.   Past Medical History:  Diagnosis Date   Menorrhagia with regular cycle 2014   Ablation endometrial, 01/02/2013   Seizures (HCC)    seizures happened with her stroke, none since   Stroke [facility]) 2008    Past Surgical History:  Procedure Laterality Date   CESAREAN SECTION     DILITATION & CURRETTAGE/HYSTROSCOPY WITH THERMACHOICE ABLATION  01/02/2013   Procedure: DILATATION & CURETTAGE/HYSTEROSCOPY WITH THERMACHOICE ABLATION;  Surgeon: [name], MD;  Location: [facility];  Service: Gynecology;  Laterality: N/A;  Total Ablation Time = 12  minutes 36 seconds     ; 86-88 deg C   TUBAL LIGATION      Prior to Admission medications   Medication Sig Start Date End Date Taking? Authorizing Provider  clopidogrel (PLAVIX) 75 MG tablet Take 75 mg by mouth every morning.     [provider]  Dextromethorphan-Quinidine (NUEDEXTA) 20-10 MG CAPS Take 1 capsule by mouth at bedtime.    [provider]  DULoxetine (CYMBALTA) 60 MG capsule Take 60 mg by mouth at bedtime.     [provider]  hydrOXYzine (VISTARIL) 50 MG capsule Take 1 capsule (50 mg total) by mouth 3 (three) times daily as needed. 09/22/21   Penn, Cranston Neighbor, NP  levETIRAcetam (KEPPRA) 500 MG tablet Take 500 mg by mouth 2 (two) times daily.    [provider]  LORazepam (ATIVAN) 0.5 MG tablet Take 0.5 mg by mouth 3 (three) times daily. 07/10/21   [provider]  LORazepam (ATIVAN) 0.5 MG tablet Take 1 tablet (0.5 mg total) by mouth 3 (three) times daily as needed for anxiety. 08/28/21   Wurst, [place], PA-C  pantoprazole (PROTONIX) 40 MG tablet Take 40 mg by mouth daily. 12/08/17   [provider]  phenytoin (DILANTIN) 50 MG tablet Chew 150 mg by mouth 2 (two) times daily.     [provider]  QUEtiapine (SEROQUEL) 400 MG tablet Take 400 mg by mouth at  bedtime.    [provider]  senna-docusate (SENOKOT-S) 8.6-50 MG tablet Take 1 tablet by mouth at bedtime. 08/01/21   Sabas Sous, MD  simvastatin (ZOCOR) 20 MG tablet Take 20 mg by mouth every morning.     [provider]    History reviewed. No pertinent family history.   Social History   Tobacco Use   Smoking status: Former   Smokeless tobacco: Former    Types: Chew  Substance Use Topics   Alcohol use: No   Drug use: No    Allergies as of 08/15/2023 - Review Complete 08/15/2023  Allergen Reaction Noted   Codeine Nausea And Vomiting 07/31/2021    Review of Systems:    All systems reviewed and negative except where noted in HPI.    Physical Exam:  BP 97/61   Pulse 91   Temp 98.7 F (37.1 C)   Ht 5\' 7"  (1.702 m)   BMI 31.32 kg/m  No LMP recorded. Patient has had an ablation.  General:   Alert,  Well-developed, well-nourished, pleasant and cooperative in NAD Lungs:  Respirations even and unlabored.  Clear throughout to auscultation.   No wheezes, crackles, or rhonchi. No acute distress. Heart:  Regular rate and rhythm; no murmurs, clicks, rubs, or gallops. Abdomen:  Normal bowel sounds.  No bruits.  Soft, and non-distended without masses, hepatosplenomegaly or hernias noted.  No Tenderness.  No guarding or rebound tenderness.    Neurologic:  Alert and oriented x3;  grossly normal neurologically. Psych:  Alert and cooperative. Normal mood and affect.  Imaging Studies: MM 3D SCREENING MAMMOGRAM BILATERAL BREAST  Result Date: 07/26/2023 CLINICAL DATA:  Screening. EXAM: DIGITAL SCREENING BILATERAL MAMMOGRAM WITH TOMOSYNTHESIS AND CAD TECHNIQUE: Bilateral screening digital craniocaudal and mediolateral oblique mammograms were obtained. Bilateral screening digital breast tomosynthesis was performed. The images were evaluated with computer-aided detection. COMPARISON:  None ACR Breast Density Category b: There are scattered areas of fibroglandular density. FINDINGS: In the left breast, a possible mass warrants further evaluation. In the right breast, no findings suspicious for malignancy. IMPRESSION: Further evaluation is suggested for a possible mass in the left breast. RECOMMENDATION: Diagnostic mammogram and possibly ultrasound of the left breast. (Code:[id number]) The patient will be contacted regarding the findings, and additional imaging will be scheduled. BI-RADS CATEGORY  0: Incomplete: Need additional imaging evaluation. Electronically Signed   By: Sherian Rein M.D.   On: 07/26/2023 16:00    Assessment and Plan:   Sharon Stevenson is a 62 y.o. y/o female has been referred for:   1.  Chronic constipation  Continue Linzess.   Increase to 290 mcg once daily.  If Linzess is not working then they will call back and we will switch to Amitiza BID.  2.  GERD  Continue Protonix 40 mg daily  3.  Colon cancer screening  Scheduling Colonoscopy I discussed risks of colonoscopy with patient to include risk of bleeding, colon perforation, and risk of sedation.  Patient expressed understanding and agrees to proceed with colonoscopy.   2-day prep, SuTab Day #1, GoLytely Day #2.  4.  History of stroke in 2008, currently on Plavix  Request permission to hold Plavix 5 days prior to colonoscopy  Follow up As needed based on Colonoscopy Results.  Celso Amy, PA-C

## 2023-08-15 NOTE — Patient Instructions (Signed)
Plavix Clearance faxed to [facility] [place].

## 2023-09-05 ENCOUNTER — Ambulatory Visit
Admission: RE | Admit: 2023-09-05 | Discharge: 2023-09-05 | Disposition: A | Discharge status: Home / Self Care | Payer: Medicaid Other | Source: Ambulatory Visit | Attending: Family Medicine | Admitting: Family Medicine

## 2023-09-05 DIAGNOSIS — R928 Other abnormal and inconclusive findings on diagnostic imaging of breast: Secondary | ICD-10-CM

## 2023-09-12 ENCOUNTER — Other Ambulatory Visit: Payer: Self-pay

## 2023-09-12 ENCOUNTER — Telehealth: Payer: Self-pay | Admitting: Physician Assistant

## 2023-09-12 ENCOUNTER — Telehealth: Payer: Self-pay

## 2023-09-12 MED ORDER — PEG 3350-KCL-NA BICARB-NACL 420 G PO SOLR: 4000.0000 mL | Freq: Once | ORAL | 0 refills | Status: AC

## 2023-09-12 NOTE — Telephone Encounter (Signed)
Patient care taker called in to request the patient prep. The patient is Sharon Stevenson on webb. Please contact the son to let him know that her order has been sent.

## 2023-09-12 NOTE — Telephone Encounter (Signed)
Spoke with patients son-Golytely sent to pharmacy.

## 2023-09-14 ENCOUNTER — Other Ambulatory Visit: Payer: Self-pay

## 2023-09-14 ENCOUNTER — Encounter: Payer: Self-pay | Admitting: Gastroenterology

## 2023-09-14 MED ORDER — PEG 3350-KCL-NA BICARB-NACL 420 G PO SOLR: 4000.0000 mL | Freq: Once | ORAL | 0 refills | Status: AC

## 2023-09-14 NOTE — Telephone Encounter (Signed)
Patient son Deniece Portela) called in about his mother prep he went to her pharmacy [facility] on [facility] and they said nothing was sent in to them. The patient son and her nurse with to the pharmacy and they advised him to call us.

## 2023-09-21 ENCOUNTER — Encounter
Admission: RE | Disposition: A | Discharge status: Home / Self Care | Payer: Self-pay | Source: Home / Self Care | Attending: Gastroenterology

## 2023-09-21 ENCOUNTER — Ambulatory Visit: Payer: Medicaid Other | Admitting: General Practice

## 2023-09-21 ENCOUNTER — Ambulatory Visit
Admission: RE | Admit: 2023-09-21 | Discharge: 2023-09-21 | Disposition: A | Discharge status: Home / Self Care | Payer: Medicaid Other | Attending: Gastroenterology | Admitting: Gastroenterology

## 2023-09-21 ENCOUNTER — Encounter: Payer: Self-pay | Admitting: Gastroenterology

## 2023-09-21 ENCOUNTER — Other Ambulatory Visit: Payer: Self-pay

## 2023-09-21 ENCOUNTER — Telehealth: Payer: Self-pay

## 2023-09-21 DIAGNOSIS — I69954 Hemiplegia and hemiparesis following unspecified cerebrovascular disease affecting left non-dominant side: Secondary | ICD-10-CM | POA: Insufficient documentation

## 2023-09-21 DIAGNOSIS — Z1211 Encounter for screening for malignant neoplasm of colon: Secondary | ICD-10-CM | POA: Insufficient documentation

## 2023-09-21 DIAGNOSIS — Z87891 Personal history of nicotine dependence: Secondary | ICD-10-CM | POA: Insufficient documentation

## 2023-09-21 DIAGNOSIS — I69920 Aphasia following unspecified cerebrovascular disease: Secondary | ICD-10-CM | POA: Insufficient documentation

## 2023-09-21 DIAGNOSIS — D122 Benign neoplasm of ascending colon: Secondary | ICD-10-CM | POA: Diagnosis not present

## 2023-09-21 DIAGNOSIS — D125 Benign neoplasm of sigmoid colon: Secondary | ICD-10-CM | POA: Diagnosis not present

## 2023-09-21 DIAGNOSIS — K5904 Chronic idiopathic constipation: Secondary | ICD-10-CM

## 2023-09-21 DIAGNOSIS — D126 Benign neoplasm of colon, unspecified: Secondary | ICD-10-CM

## 2023-09-21 HISTORY — PX: HEMOSTASIS CLIP PLACEMENT: SHX6857

## 2023-09-21 HISTORY — PX: POLYPECTOMY: SHX5525

## 2023-09-21 HISTORY — PX: COLONOSCOPY WITH PROPOFOL: SHX5780

## 2023-09-21 SURGERY — COLONOSCOPY WITH PROPOFOL
Anesthesia: General

## 2023-09-21 MED ORDER — LIDOCAINE HCL (CARDIAC) PF 100 MG/5ML IV SOSY
PREFILLED_SYRINGE | INTRAVENOUS | Status: DC | PRN
  Administered 2023-09-21: 100 mg via INTRAVENOUS

## 2023-09-21 MED ORDER — PHENYLEPHRINE 80 MCG/ML (10ML) SYRINGE FOR IV PUSH (FOR BLOOD PRESSURE SUPPORT)
PREFILLED_SYRINGE | INTRAVENOUS | Status: DC | PRN
  Administered 2023-09-21 (×2): 80 ug via INTRAVENOUS

## 2023-09-21 MED ORDER — PROPOFOL 500 MG/50ML IV EMUL
INTRAVENOUS | Status: DC | PRN
  Administered 2023-09-21: 145 ug/kg/min via INTRAVENOUS

## 2023-09-21 MED ORDER — PROPOFOL 10 MG/ML IV BOLUS
INTRAVENOUS | Status: DC | PRN
  Administered 2023-09-21: 60 mg via INTRAVENOUS

## 2023-09-21 MED ORDER — SODIUM CHLORIDE 0.9 % IV SOLN: INTRAVENOUS | Status: DC

## 2023-09-21 MED ORDER — NORMAL SALINE NICU FLUSH
INTRAVENOUS | Status: DC | PRN
  Administered 2023-09-21: 3 mL via INTRAVENOUS

## 2023-09-21 NOTE — Op Note (Signed)
[facility] Patient Name: Sharon Stevenson Procedure Date: 09/21/2023 11:42 AM MRN: [id number] Account #: [id number] Date of Birth: 03-13-1961 Admit Type: Outpatient Age: 62 Room: [facility] ENDO ROOM 3 Gender: Female Note Status: Finalized Instrument Name: Prentice Docker [id number] Procedure:             Colonoscopy Indications:           Screening for colorectal malignant neoplasm Providers:             Wyline Mood MD, MD Referring MD:          Julian Reil. Bucio (Referring MD) Medicines:             Monitored Anesthesia Care Complications:         No immediate complications. Procedure:             Pre-Anesthesia Assessment:                        - Prior to the procedure, a History and Physical was                         performed, and patient medications, allergies and                         sensitivities were reviewed. The patient's tolerance                         of previous anesthesia was reviewed.                        - The risks and benefits of the procedure and the                         sedation options and risks were discussed with the                         patient. All questions were answered and informed                         consent was obtained.                        - ASA Grade Assessment: II - A patient with mild                         systemic disease.                        After obtaining informed consent, the colonoscope was                         passed under direct vision. Throughout the procedure,                         the patient's blood pressure, pulse, and oxygen                         saturations were monitored continuously. The                         Colonoscope was introduced  through the anus and                         advanced to the the cecum, identified by the                         appendiceal orifice. The colonoscopy was performed                         with ease. The patient tolerated the procedure well.                          The quality of the bowel preparation was adequate. The                         ileocecal valve, appendiceal orifice, and rectum were                         photographed. Findings:      The perianal and digital rectal examinations were normal.      A 16 mm polyp was found in the proximal ascending colon. The polyp was       sessile. The polyp was removed with a saline injection-lift technique       using a hot snare. Resection and retrieval were complete. To prevent       bleeding after the polypectomy, one hemostatic clip was successfully       placed. Clip manufacturer: [employer]. There was no bleeding at       the end of the procedure.      A 12 mm polyp was found in the sigmoid colon. The polyp was       semi-sessile. The polyp was removed with a hot snare. Resection and       retrieval were complete.      The exam was otherwise without abnormality on direct and retroflexion       views. Impression:            - One 16 mm polyp in the proximal ascending colon,                         removed using injection-lift and a hot snare. Resected                         and retrieved. Clip was placed. Clip manufacturer:                         [employer].                        - One 12 mm polyp in the sigmoid colon, removed with a                         hot snare. Resected and retrieved.                        - The examination was otherwise normal on direct and                         retroflexion views. Recommendation:        -  Discharge patient to home (with escort).                        - Resume previous diet.                        - Continue present medications.                        - Await pathology results.                        - Repeat colonoscopy in 3 years for surveillance. Procedure Code(s):     --- Professional ---                        [phone], Colonoscopy, flexible; with removal of                         tumor(s), polyp(s), or other  lesion(s) by snare                         technique                        [id number], Colonoscopy, flexible; with directed submucosal                         injection(s), any substance Diagnosis Code(s):     --- Professional ---                        Z12.11, Encounter for screening for malignant neoplasm                         of colon                        D12.2, Benign neoplasm of ascending colon                        D12.5, Benign neoplasm of sigmoid colon CPT copyright 2022 American Medical Association. All rights reserved. The codes documented in this report are preliminary and upon coder review may  be revised to meet current compliance requirements. Wyline Mood, MD Wyline Mood MD, MD 09/21/2023 1:13:39 PM This report has been signed electronically. Number of Addenda: 0 Note Initiated On: 09/21/2023 11:42 AM Scope Withdrawal Time: 0 hours 18 minutes 50 seconds  Total Procedure Duration: 0 hours 25 minutes 8 seconds  Estimated Blood Loss:  Estimated blood loss: none.      [facility]

## 2023-09-21 NOTE — Telephone Encounter (Signed)
Patient is cleared to have procedure colonoscopy and should hold Plavix 5 days prior.received from [facility] NP.

## 2023-09-21 NOTE — Anesthesia Preprocedure Evaluation (Addendum)
Anesthesia Evaluation  Patient identified by MRN, date of birth, ID band Patient awake    Reviewed: Allergy & Precautions, H&P , NPO status , Patient's Chart, lab work & pertinent test results  Airway Mallampati: I  TM Distance: >3 FB Neck ROM: full    Dental  (+) Edentulous Upper, Edentulous Lower   Pulmonary former smoker   Pulmonary exam normal breath sounds clear to auscultation       Cardiovascular negative cardio ROS Normal cardiovascular exam Rhythm:Regular Rate:Normal     Neuro/Psych CVA (aphasia, L hemiparesis), Residual Symptoms negative neurological ROS  negative psych ROS   GI/Hepatic negative GI ROS, Neg liver ROS,,,  Endo/Other  negative endocrine ROS    Renal/GU negative Renal ROS  negative genitourinary   Musculoskeletal   Abdominal   Peds  Hematology negative hematology ROS (+)   Anesthesia Other Findings Past Medical History: 2014: Menorrhagia with regular cycle     Comment:  Ablation endometrial, 01/02/2013 No date: Seizures (HCC)     Comment:  seizures happened with her stroke, none since 2008: Stroke [facility])  Past Surgical History: No date: CESAREAN SECTION 01/02/2013: DILITATION & CURRETTAGE/HYSTROSCOPY WITH THERMACHOICE  ABLATION     Comment:  Procedure: DILATATION & CURETTAGE/HYSTEROSCOPY WITH               THERMACHOICE ABLATION;  Surgeon: [name], MD;                Location: [facility];  Service: Gynecology;  Laterality: N/A;              Total Ablation Time = 12 minutes 36 seconds     ; 86-88               deg C No date: TUBAL LIGATION     Reproductive/Obstetrics negative OB ROS                             Anesthesia Physical Anesthesia Plan  ASA: 3  Anesthesia Plan: General   Post-op Pain Management: Minimal or no pain anticipated   Induction: Intravenous  PONV Risk Score and Plan: 3 and Propofol infusion, TIVA and Ondansetron  Airway  Management Planned: Nasal Cannula  Additional Equipment: None  Intra-op Plan:   Post-operative Plan:   Informed Consent: I have reviewed the patients History and Physical, chart, labs and discussed the procedure including the risks, benefits and alternatives for the proposed anesthesia with the patient or authorized representative who has indicated his/her understanding and acceptance.     Dental advisory given and Consent reviewed with POA  Plan Discussed with: CRNA and Surgeon  Anesthesia Plan Comments: (Discussed risks of anesthesia with patient's son, including possibility of difficulty with spontaneous ventilation under anesthesia necessitating airway intervention, PONV, and rare risks such as cardiac or respiratory or neurological events, and allergic reactions. Discussed the role of CRNA in patient's perioperative care. Patient's son understands.)       Anesthesia Quick Evaluation

## 2023-09-21 NOTE — H&P (Addendum)
Wyline Mood, MD [address], [place], [place], [address] [address], [address], [place], [place], [address] Phone: [phone]  Fax: [phone]  Primary Care Physician:  Shelby Dubin, FNP   Pre-Procedure History & Physical: HPI:  Sharon Stevenson is a 62 y.o. female is here for an colonoscopy.   Past Medical History:  Diagnosis Date   Menorrhagia with regular cycle 2014   Ablation endometrial, 01/02/2013   Seizures (HCC)    seizures happened with her stroke, none since   Stroke [facility]) 2008    Past Surgical History:  Procedure Laterality Date   CESAREAN SECTION     DILITATION & CURRETTAGE/HYSTROSCOPY WITH THERMACHOICE ABLATION  01/02/2013   Procedure: DILATATION & CURETTAGE/HYSTEROSCOPY WITH THERMACHOICE ABLATION;  Surgeon: [name], MD;  Location: [facility];  Service: Gynecology;  Laterality: N/A;  Total Ablation Time = 12 minutes 36 seconds     ; 86-88 deg C   TUBAL LIGATION      Prior to Admission medications   Medication Sig Start Date End Date Taking? Authorizing Provider  clopidogrel (PLAVIX) 75 MG tablet Take 75 mg by mouth every morning.     [provider]  Dextromethorphan-Quinidine (NUEDEXTA) 20-10 MG CAPS Take 1 capsule by mouth at bedtime.    [provider]  DULoxetine (CYMBALTA) 60 MG capsule Take 60 mg by mouth at bedtime.     [provider]  hydrOXYzine (VISTARIL) 50 MG capsule Take 1 capsule (50 mg total) by mouth 3 (three) times daily as needed. 09/22/21   Penn, Cranston Neighbor, NP  levETIRAcetam (KEPPRA) 500 MG tablet Take 500 mg by mouth 2 (two) times daily.    [provider]  linaclotide Karlene Einstein) 290 MCG CAPS capsule Take 1 capsule (290 mcg total) by mouth daily before breakfast. 08/15/23 02/11/24  Celso Amy, PA-C  LORazepam (ATIVAN) 0.5 MG tablet Take 0.5 mg by mouth 3 (three) times daily. 07/10/21   [provider]  LORazepam (ATIVAN) 0.5 MG tablet Take 1 tablet (0.5 mg total) by mouth 3 (three)  times daily as needed for anxiety. 08/28/21   Wurst, [place], PA-C  pantoprazole (PROTONIX) 40 MG tablet Take 40 mg by mouth daily. 12/08/17   [provider]  phenytoin (DILANTIN) 50 MG tablet Chew 150 mg by mouth 2 (two) times daily.     [provider]  QUEtiapine (SEROQUEL) 400 MG tablet Take 400 mg by mouth at bedtime.    [provider]  senna-docusate (SENOKOT-S) 8.6-50 MG tablet Take 1 tablet by mouth at bedtime. 08/01/21   Sabas Sous, MD  simvastatin (ZOCOR) 20 MG tablet Take 20 mg by mouth every morning.     [provider]    Allergies as of 08/16/2023 - Review Complete 08/15/2023  Allergen Reaction Noted   Codeine Nausea And Vomiting 07/31/2021    History reviewed. No pertinent family history.  Social History   Socioeconomic History   Marital status: Single    Spouse name: Not on file   Number of children: Not on file   Years of education: Not on file   Highest education level: Not on file  Occupational History   Not on file  Tobacco Use   Smoking status: Former   Smokeless tobacco: Former    Types: Designer, multimedia Use   Vaping status: Never Used  Substance and Sexual Activity   Alcohol use: No   Drug use: No   Sexual activity: Never  Birth control/protection: Surgical  Other Topics Concern   Not on file  Social History Narrative   Not on file   Social Determinants of Health   Financial Resource Strain: Not on file  Food Insecurity: Not on file  Transportation Needs: Not on file  Physical Activity: Not on file  Stress: Not on file  Social Connections: Not on file  Intimate Partner Violence: Not on file    Review of Systems: See HPI, otherwise negative ROS  Physical Exam: There were no vitals taken for this visit. General:   confused Head:  Normocephalic and atraumatic. Neck:  Supple; no masses or thyromegaly. Lungs:  Clear throughout to auscultation, normal respiratory effort.    Heart:  +S1, +S2, Regular  rate and rhythm, No edema. Abdomen:  Soft, nontender and nondistended. Normal bowel sounds, without guarding, and without rebound.   Neurologic:  Alert and  oriented x0   Impression/Plan: Sharon Stevenson is here for an colonoscopy to be performed for Screening colonoscopy average risk   Risks, benefits, limitations, and alternatives regarding  colonoscopy have been reviewed with the patients son .  Questions have been answered.  All parties agreeable.   Wyline Mood, MD  09/21/2023, 12:20 PM

## 2023-09-21 NOTE — Transfer of Care (Signed)
Immediate Anesthesia Transfer of Care Note  Patient: Britanni A Vanwyhe  Procedure(s) Performed: COLONOSCOPY WITH PROPOFOL POLYPECTOMY  Patient Location: Endoscopy Unit  Anesthesia Type:General  Level of Consciousness: drowsy and patient cooperative  Airway & Oxygen Therapy: Patient Spontanous Breathing and Patient connected to face mask oxygen  Post-op Assessment: Report given to RN and Post -op Vital signs reviewed and stable  Post vital signs: Reviewed and stable  Last Vitals:  Vitals Value Taken Time  BP 90/59 09/21/23 1316  Temp    Pulse 41 09/21/23 1316  Resp 18 09/21/23 1317  SpO2 62 % 09/21/23 1316  Vitals shown include unfiled device data.  Last Pain:  Vitals:   09/21/23 1223  TempSrc: Temporal  PainSc: 0-No pain         Complications: No notable events documented.

## 2023-09-21 NOTE — Anesthesia Procedure Notes (Signed)
Procedure Name: General with mask airway Date/Time: 09/21/2023 12:46 PM  Performed by: Mohammed Kindle, CRNAPre-anesthesia Checklist: Patient identified, Emergency Drugs available, Suction available and Patient being monitored Patient Re-evaluated:Patient Re-evaluated prior to induction Oxygen Delivery Method: Simple face mask Induction Type: IV induction Placement Confirmation: positive ETCO2, CO2 detector and breath sounds checked- equal and bilateral Dental Injury: Teeth and Oropharynx as per pre-operative assessment

## 2023-09-22 ENCOUNTER — Encounter: Payer: Self-pay | Admitting: Gastroenterology

## 2023-09-22 LAB — SURGICAL PATHOLOGY

## 2023-09-22 NOTE — Anesthesia Postprocedure Evaluation (Addendum)
Anesthesia Post Note  Patient: Sharon Stevenson  Procedure(s) Performed: COLONOSCOPY WITH PROPOFOL POLYPECTOMY HEMOSTASIS CLIP PLACEMENT  Patient location during evaluation: Endoscopy Anesthesia Type: General Level of consciousness: awake and alert Pain management: pain level controlled Vital Signs Assessment: post-procedure vital signs reviewed and stable Respiratory status: spontaneous breathing, nonlabored ventilation, respiratory function stable and patient connected to nasal cannula oxygen Cardiovascular status: blood pressure returned to baseline and stable Postop Assessment: no apparent nausea or vomiting Anesthetic complications: no  No notable events documented.   Last Vitals:  Vitals:   09/21/23 1316 09/21/23 1336  BP: (!) 90/59 128/78  Pulse: (!) 55   Resp: 16 (!) 23  Temp: (!) 35.9 C   SpO2: 100%     Last Pain:  Vitals:   09/22/23 0750  TempSrc:   PainSc: 0-No pain                 Stephanie Coup

## 2023-09-27 ENCOUNTER — Encounter: Payer: Self-pay | Admitting: Gastroenterology

## 2023-09-28 NOTE — Plan of Care (Signed)
[facility] Tonsillectomy/Adenoidectomy, Postoperative PEDS care plan entered in error.

## 2024-09-24 ENCOUNTER — Emergency Department

## 2024-09-24 ENCOUNTER — Encounter: Payer: Self-pay | Admitting: Emergency Medicine

## 2024-09-24 ENCOUNTER — Other Ambulatory Visit: Payer: Self-pay

## 2024-09-24 ENCOUNTER — Inpatient Hospital Stay
Admission: EM | Admit: 2024-09-24 | Discharge: 2024-09-28 | DRG: 854 | Disposition: A | Discharge status: Home / Self Care | Attending: Internal Medicine | Admitting: Internal Medicine

## 2024-09-24 ENCOUNTER — Encounter
Admission: EM | Disposition: A | Discharge status: Home Health | Payer: Self-pay | Source: Home / Self Care | Attending: Internal Medicine

## 2024-09-24 DIAGNOSIS — Z6829 Body mass index (BMI) 29.0-29.9, adult: Secondary | ICD-10-CM

## 2024-09-24 DIAGNOSIS — Z1152 Encounter for screening for COVID-19: Secondary | ICD-10-CM

## 2024-09-24 DIAGNOSIS — F32A Depression, unspecified: Secondary | ICD-10-CM | POA: Insufficient documentation

## 2024-09-24 DIAGNOSIS — N201 Calculus of ureter: Secondary | ICD-10-CM | POA: Diagnosis not present

## 2024-09-24 DIAGNOSIS — N179 Acute kidney failure, unspecified: Secondary | ICD-10-CM | POA: Diagnosis present

## 2024-09-24 DIAGNOSIS — E785 Hyperlipidemia, unspecified: Secondary | ICD-10-CM | POA: Diagnosis present

## 2024-09-24 DIAGNOSIS — D259 Leiomyoma of uterus, unspecified: Secondary | ICD-10-CM | POA: Diagnosis present

## 2024-09-24 DIAGNOSIS — F419 Anxiety disorder, unspecified: Secondary | ICD-10-CM | POA: Diagnosis present

## 2024-09-24 DIAGNOSIS — Z79899 Other long term (current) drug therapy: Secondary | ICD-10-CM | POA: Diagnosis not present

## 2024-09-24 DIAGNOSIS — D696 Thrombocytopenia, unspecified: Secondary | ICD-10-CM | POA: Diagnosis present

## 2024-09-24 DIAGNOSIS — E162 Hypoglycemia, unspecified: Secondary | ICD-10-CM | POA: Diagnosis present

## 2024-09-24 DIAGNOSIS — K5909 Other constipation: Secondary | ICD-10-CM | POA: Diagnosis present

## 2024-09-24 DIAGNOSIS — E878 Other disorders of electrolyte and fluid balance, not elsewhere classified: Secondary | ICD-10-CM | POA: Diagnosis present

## 2024-09-24 DIAGNOSIS — E66811 Obesity, class 1: Secondary | ICD-10-CM | POA: Diagnosis present

## 2024-09-24 DIAGNOSIS — J9811 Atelectasis: Secondary | ICD-10-CM | POA: Diagnosis present

## 2024-09-24 DIAGNOSIS — E871 Hypo-osmolality and hyponatremia: Secondary | ICD-10-CM | POA: Diagnosis present

## 2024-09-24 DIAGNOSIS — Z419 Encounter for procedure for purposes other than remedying health state, unspecified: Secondary | ICD-10-CM | POA: Diagnosis present

## 2024-09-24 DIAGNOSIS — I959 Hypotension, unspecified: Secondary | ICD-10-CM

## 2024-09-24 DIAGNOSIS — Z7401 Bed confinement status: Secondary | ICD-10-CM | POA: Diagnosis not present

## 2024-09-24 DIAGNOSIS — I700 Atherosclerosis of aorta: Secondary | ICD-10-CM | POA: Diagnosis present

## 2024-09-24 DIAGNOSIS — B9620 Unspecified Escherichia coli [E. coli] as the cause of diseases classified elsewhere: Secondary | ICD-10-CM | POA: Diagnosis present

## 2024-09-24 DIAGNOSIS — B964 Proteus (mirabilis) (morganii) as the cause of diseases classified elsewhere: Secondary | ICD-10-CM | POA: Diagnosis present

## 2024-09-24 DIAGNOSIS — N136 Pyonephrosis: Secondary | ICD-10-CM | POA: Diagnosis present

## 2024-09-24 DIAGNOSIS — I69320 Aphasia following cerebral infarction: Secondary | ICD-10-CM | POA: Diagnosis not present

## 2024-09-24 DIAGNOSIS — N139 Obstructive and reflux uropathy, unspecified: Secondary | ICD-10-CM | POA: Diagnosis present

## 2024-09-24 DIAGNOSIS — Z87891 Personal history of nicotine dependence: Secondary | ICD-10-CM | POA: Diagnosis not present

## 2024-09-24 DIAGNOSIS — G40909 Epilepsy, unspecified, not intractable, without status epilepticus: Secondary | ICD-10-CM | POA: Diagnosis present

## 2024-09-24 DIAGNOSIS — A419 Sepsis, unspecified organism: Secondary | ICD-10-CM

## 2024-09-24 DIAGNOSIS — N390 Urinary tract infection, site not specified: Secondary | ICD-10-CM

## 2024-09-24 DIAGNOSIS — A4150 Gram-negative sepsis, unspecified: Secondary | ICD-10-CM | POA: Diagnosis present

## 2024-09-24 DIAGNOSIS — I739 Peripheral vascular disease, unspecified: Secondary | ICD-10-CM | POA: Diagnosis present

## 2024-09-24 DIAGNOSIS — K219 Gastro-esophageal reflux disease without esophagitis: Secondary | ICD-10-CM | POA: Diagnosis present

## 2024-09-24 DIAGNOSIS — Z7902 Long term (current) use of antithrombotics/antiplatelets: Secondary | ICD-10-CM

## 2024-09-24 HISTORY — PX: CYSTOSCOPY/RETROGRADE/URETEROSCOPY/STONE EXTRACTION WITH BASKET: SHX5317

## 2024-09-24 LAB — MAGNESIUM: Magnesium: 1.6 mg/dL — ABNORMAL LOW (ref 1.7–2.4)

## 2024-09-24 LAB — LACTIC ACID, PLASMA: Lactic Acid, Venous: 1.7 mmol/L (ref 0.5–1.9)

## 2024-09-24 LAB — CBC
HCT: 34.9 % — ABNORMAL LOW (ref 36.0–46.0)
Hemoglobin: 11.8 g/dL — ABNORMAL LOW (ref 12.0–15.0)
MCH: 29.1 pg (ref 26.0–34.0)
MCHC: 33.8 g/dL (ref 30.0–36.0)
MCV: 86.2 fL (ref 80.0–100.0)
Platelets: 119 K/uL — ABNORMAL LOW (ref 150–400)
RBC: 4.05 MIL/uL (ref 3.87–5.11)
RDW: 13.4 % (ref 11.5–15.5)
WBC: 13.9 K/uL — ABNORMAL HIGH (ref 4.0–10.5)
nRBC: 0 % (ref 0.0–0.2)

## 2024-09-24 LAB — COMPREHENSIVE METABOLIC PANEL WITH GFR
ALT: 18 U/L (ref 0–44)
AST: 24 U/L (ref 15–41)
Albumin: 2.3 g/dL — ABNORMAL LOW (ref 3.5–5.0)
Alkaline Phosphatase: 138 U/L — ABNORMAL HIGH (ref 38–126)
Anion gap: 15 (ref 5–15)
BUN: 85 mg/dL — ABNORMAL HIGH (ref 8–23)
CO2: 18 mmol/L — ABNORMAL LOW (ref 22–32)
Calcium: 7.7 mg/dL — ABNORMAL LOW (ref 8.9–10.3)
Chloride: 95 mmol/L — ABNORMAL LOW (ref 98–111)
Creatinine, Ser: 5.01 mg/dL — ABNORMAL HIGH (ref 0.44–1.00)
GFR, Estimated: 9 mL/min — ABNORMAL LOW (ref >60)
Glucose, Bld: 88 mg/dL (ref 70–99)
Potassium: 3.7 mmol/L (ref 3.5–5.1)
Sodium: 128 mmol/L — ABNORMAL LOW (ref 135–145)
Total Bilirubin: 1.9 mg/dL — ABNORMAL HIGH (ref 0.0–1.2)
Total Protein: 6.6 g/dL (ref 6.5–8.1)

## 2024-09-24 LAB — URINALYSIS, ROUTINE W REFLEX MICROSCOPIC
Bacteria, UA: NONE SEEN
Bilirubin Urine: NEGATIVE
Glucose, UA: NEGATIVE mg/dL
Ketones, ur: NEGATIVE mg/dL
Nitrite: NEGATIVE
Protein, ur: 100 mg/dL — AB
RBC / HPF: >50 RBC/hpf (ref 0–5)
Specific Gravity, Urine: 1.005 (ref 1.005–1.030)
Squamous Epithelial / HPF: 0 /HPF (ref 0–5)
WBC, UA: >50 WBC/hpf (ref 0–5)
pH: 5 (ref 5.0–8.0)

## 2024-09-24 LAB — RESP PANEL BY RT-PCR (RSV, FLU A&B, COVID)  RVPGX2
Influenza A by PCR: NEGATIVE
Influenza B by PCR: NEGATIVE
Resp Syncytial Virus by PCR: NEGATIVE
SARS Coronavirus 2 by RT PCR: NEGATIVE

## 2024-09-24 LAB — PHENYTOIN LEVEL, TOTAL: Phenytoin Lvl: 7.3 ug/mL — ABNORMAL LOW (ref 10.0–20.0)

## 2024-09-24 LAB — CBG MONITORING, ED
Glucose-Capillary: 58 mg/dL — ABNORMAL LOW (ref 70–99)
Glucose-Capillary: 127 mg/dL — ABNORMAL HIGH (ref 70–99)

## 2024-09-24 LAB — PROCALCITONIN: Procalcitonin: 136.16 ng/mL

## 2024-09-24 SURGERY — CYSTOSCOPY, RIGID, WITH STENT REPLACEMENT
Anesthesia: General | Laterality: Left

## 2024-09-24 MED ORDER — ONDANSETRON HCL 4 MG/2ML IJ SOLN
4.0000 mg | Freq: Four times a day (QID) | INTRAMUSCULAR | Status: DC | PRN
  Administered 2024-09-25: 4 mg via INTRAVENOUS

## 2024-09-24 MED ORDER — PHENYTOIN 50 MG PO CHEW
150.0000 mg | CHEWABLE_TABLET | Freq: Two times a day (BID) | ORAL | Status: DC
  Administered 2024-09-25 – 2024-09-28 (×7): 150 mg via ORAL
  Filled 2024-09-24 (×7): qty 3

## 2024-09-24 MED ORDER — MAGNESIUM HYDROXIDE 400 MG/5ML PO SUSP: 30.0000 mL | Freq: Every day | ORAL | Status: DC | PRN

## 2024-09-24 MED ORDER — LINACLOTIDE 145 MCG PO CAPS
290.0000 ug | ORAL_CAPSULE | Freq: Every day | ORAL | Status: DC
  Administered 2024-09-25 – 2024-09-28 (×4): 290 ug via ORAL
  Filled 2024-09-24 (×4): qty 2

## 2024-09-24 MED ORDER — TRAZODONE HCL 50 MG PO TABS: 25.0000 mg | ORAL_TABLET | Freq: Every evening | ORAL | Status: DC | PRN

## 2024-09-24 MED ORDER — LACTATED RINGERS IV BOLUS
1000.0000 mL | Freq: Once | INTRAVENOUS | Status: AC
  Administered 2024-09-24: 1000 mL via INTRAVENOUS

## 2024-09-24 MED ORDER — SODIUM CHLORIDE 0.9 % IV SOLN
2.0000 g | Freq: Once | INTRAVENOUS | Status: AC
  Administered 2024-09-24: 2 g via INTRAVENOUS
  Filled 2024-09-24: qty 12.5

## 2024-09-24 MED ORDER — SODIUM CHLORIDE 0.9 % IV BOLUS (SEPSIS): 1000.0000 mL | Freq: Once | INTRAVENOUS | Status: DC

## 2024-09-24 MED ORDER — DULOXETINE HCL 60 MG PO CPEP
60.0000 mg | ORAL_CAPSULE | Freq: Every day | ORAL | Status: DC
  Administered 2024-09-25 – 2024-09-27 (×3): 60 mg via ORAL
  Filled 2024-09-24: qty 1
  Filled 2024-09-24 (×3): qty 2
  Filled 2024-09-24 (×2): qty 1

## 2024-09-24 MED ORDER — VANCOMYCIN HCL IN DEXTROSE 1-5 GM/200ML-% IV SOLN
1000.0000 mg | Freq: Once | INTRAVENOUS | Status: AC
  Administered 2024-09-24: 1000 mg via INTRAVENOUS
  Filled 2024-09-24: qty 200

## 2024-09-24 MED ORDER — DEXTROMETHORPHAN-QUINIDINE 20-10 MG PO CAPS
1.0000 | ORAL_CAPSULE | Freq: Every day | ORAL | Status: DC
  Administered 2024-09-25: 1 via ORAL
  Filled 2024-09-24 (×3): qty 1

## 2024-09-24 MED ORDER — MAGNESIUM SULFATE 2 GM/50ML IV SOLN: 2.0000 g | Freq: Once | INTRAVENOUS | Status: DC

## 2024-09-24 MED ORDER — DEXTROSE 50 % IV SOLN
12.5000 g | Freq: Once | INTRAVENOUS | Status: AC
  Administered 2024-09-24: 12.5 g via INTRAVENOUS
  Filled 2024-09-24: qty 50

## 2024-09-24 MED ORDER — ACETAMINOPHEN 325 MG PO TABS: 650.0000 mg | ORAL_TABLET | Freq: Four times a day (QID) | ORAL | Status: DC | PRN

## 2024-09-24 MED ORDER — ENOXAPARIN SODIUM 40 MG/0.4ML IJ SOSY: 40.0000 mg | PREFILLED_SYRINGE | INTRAMUSCULAR | Status: DC

## 2024-09-24 MED ORDER — LORAZEPAM 0.5 MG PO TABS
0.5000 mg | ORAL_TABLET | Freq: Three times a day (TID) | ORAL | Status: DC
  Administered 2024-09-25 – 2024-09-28 (×10): 0.5 mg via ORAL
  Filled 2024-09-24 (×10): qty 1

## 2024-09-24 MED ORDER — LEVETIRACETAM 500 MG PO TABS
500.0000 mg | ORAL_TABLET | Freq: Two times a day (BID) | ORAL | Status: DC
  Administered 2024-09-25 – 2024-09-28 (×7): 500 mg via ORAL
  Filled 2024-09-24 (×7): qty 1

## 2024-09-24 MED ORDER — SODIUM CHLORIDE 0.9 % IV SOLN
1.0000 g | INTRAVENOUS | Status: DC
  Administered 2024-09-25 – 2024-09-26 (×2): 1 g via INTRAVENOUS
  Filled 2024-09-24 (×2): qty 10

## 2024-09-24 MED ORDER — LACTATED RINGERS IV SOLN
150.0000 mL/h | INTRAVENOUS | Status: AC
  Administered 2024-09-25 (×2): 150 mL/h via INTRAVENOUS

## 2024-09-24 MED ORDER — SIMVASTATIN 20 MG PO TABS
20.0000 mg | ORAL_TABLET | Freq: Every evening | ORAL | Status: DC
  Administered 2024-09-25 – 2024-09-27 (×3): 20 mg via ORAL
  Filled 2024-09-24 (×3): qty 1

## 2024-09-24 MED ORDER — QUETIAPINE FUMARATE 200 MG PO TABS
400.0000 mg | ORAL_TABLET | Freq: Every day | ORAL | Status: DC
Start: 2024-09-25 — End: 2024-09-28
  Administered 2024-09-25 – 2024-09-27 (×3): 400 mg via ORAL
  Filled 2024-09-24 (×3): qty 2

## 2024-09-24 MED ORDER — ONDANSETRON HCL 4 MG PO TABS: 4.0000 mg | ORAL_TABLET | Freq: Four times a day (QID) | ORAL | Status: DC | PRN

## 2024-09-24 MED ORDER — ACETAMINOPHEN 650 MG RE SUPP: 650.0000 mg | Freq: Four times a day (QID) | RECTAL | Status: DC | PRN

## 2024-09-24 MED ORDER — CLOPIDOGREL BISULFATE 75 MG PO TABS
75.0000 mg | ORAL_TABLET | Freq: Every morning | ORAL | Status: DC
  Administered 2024-09-25 – 2024-09-28 (×4): 75 mg via ORAL
  Filled 2024-09-24 (×4): qty 1

## 2024-09-24 MED ORDER — METRONIDAZOLE 500 MG/100ML IV SOLN
500.0000 mg | Freq: Once | INTRAVENOUS | Status: AC
  Administered 2024-09-24: 500 mg via INTRAVENOUS
  Filled 2024-09-24: qty 100

## 2024-09-24 MED ORDER — PANTOPRAZOLE SODIUM 40 MG PO TBEC
40.0000 mg | DELAYED_RELEASE_TABLET | Freq: Every day | ORAL | Status: DC
  Administered 2024-09-25 – 2024-09-28 (×4): 40 mg via ORAL
  Filled 2024-09-24 (×4): qty 1

## 2024-09-24 MED ORDER — HYDROXYZINE HCL 50 MG PO TABS: 50.0000 mg | ORAL_TABLET | Freq: Three times a day (TID) | ORAL | Status: DC | PRN

## 2024-09-24 MED ORDER — LACTATED RINGERS IV SOLN: INTRAVENOUS | Status: DC

## 2024-09-24 MED ORDER — SENNOSIDES-DOCUSATE SODIUM 8.6-50 MG PO TABS
1.0000 | ORAL_TABLET | Freq: Every day | ORAL | Status: DC
  Administered 2024-09-25 – 2024-09-27 (×3): 1 via ORAL
  Filled 2024-09-24 (×3): qty 1

## 2024-09-24 SURGICAL SUPPLY — 15 items
BAG DRAIN SIEMENS DORNER NS (MISCELLANEOUS) ×1 IMPLANT
BAG URO DRAIN 4000ML (MISCELLANEOUS) IMPLANT
BRUSH SCRUB EZ 4% CHG (MISCELLANEOUS) ×1 IMPLANT
CATH URETL OPEN 5X70 (CATHETERS) ×1 IMPLANT
CATH URTH 16FR FL 2W BLN LF (CATHETERS) IMPLANT
GLOVE BIOGEL PI IND STRL 7.0 (GLOVE) ×1 IMPLANT
GOWN STRL REUS W/ TWL LRG LVL3 (GOWN DISPOSABLE) ×2 IMPLANT
GUIDEWIRE STR DUAL SENSOR (WIRE) ×1 IMPLANT
KIT TURNOVER CYSTO (KITS) ×1 IMPLANT
PACK CYSTO AR (MISCELLANEOUS) ×1 IMPLANT
SET CYSTO IRRIGATION (SET/KITS/TRAYS/PACK) ×1 IMPLANT
SOL .9 NS 3000ML IRR UROMATIC (IV SOLUTION) ×1 IMPLANT
STENT URET 6FRX24 CONTOUR (STENTS) IMPLANT
SURGILUBE 2OZ TUBE FLIPTOP (MISCELLANEOUS) ×1 IMPLANT
WATER STERILE IRR 500ML POUR (IV SOLUTION) ×1 IMPLANT

## 2024-09-24 NOTE — ED Notes (Signed)
 Bed alarm turned on, family at bedside

## 2024-09-24 NOTE — H&P (Incomplete)
 [place]   PATIENT NAME: Sharon Stevenson    MR#:  [id number]  DATE OF BIRTH:  03/30/1961  DATE OF ADMISSION:  09/24/2024  PRIMARY CARE PHYSICIAN: Bucio, Silvio [name], FNP   Patient is coming from: Home  REQUESTING/REFERRING PHYSICIAN: Ward, Josette SAILOR, DO  CHIEF COMPLAINT:   Chief Complaint  Patient presents with   Weakness   Hypotension    HISTORY OF PRESENT ILLNESS:  Sharon Stevenson is a 63 y.o. female with medical history significant for ***  ED Course: *** EKG as reviewed by me : *** Imaging: *** PAST MEDICAL HISTORY:   Past Medical History:  Diagnosis Date   Menorrhagia with regular cycle 2014   Ablation endometrial, 01/02/2013   Seizures (HCC)    seizures happened with her stroke, none since   Stroke [facility]) 2008    PAST SURGICAL HISTORY:   Past Surgical History:  Procedure Laterality Date   CESAREAN SECTION     COLONOSCOPY WITH PROPOFOL  N/A 09/21/2023   Procedure: COLONOSCOPY WITH PROPOFOL ;  Surgeon: Therisa Bi, MD;  Location: [facility] ENDOSCOPY;  Service: Gastroenterology;  Laterality: N/A;   DILITATION & CURRETTAGE/HYSTROSCOPY WITH THERMACHOICE ABLATION  01/02/2013   Procedure: DILATATION & CURETTAGE/HYSTEROSCOPY WITH THERMACHOICE ABLATION;  Surgeon: Norleen LULLA Server, MD;  Location: [facility] ORS;  Service: Gynecology;  Laterality: N/A;  Total Ablation Time = 12 minutes 36 seconds     ; 86-88 deg C   HEMOSTASIS CLIP PLACEMENT  09/21/2023   Procedure: HEMOSTASIS CLIP PLACEMENT;  Surgeon: Therisa Bi, MD;  Location: [facility] ENDOSCOPY;  Service: Gastroenterology;;   POLYPECTOMY  09/21/2023   Procedure: POLYPECTOMY;  Surgeon: Therisa Bi, MD;  Location: [facility] ENDOSCOPY;  Service: Gastroenterology;;   TUBAL LIGATION      SOCIAL HISTORY:   Social History   Tobacco Use   Smoking status: Former   Smokeless tobacco: Former    Types: Chew  Substance Use Topics   Alcohol use: No    FAMILY HISTORY:  History reviewed. No pertinent family history.  DRUG ALLERGIES:    Allergies  Allergen Reactions   Codeine Nausea And Vomiting    REVIEW OF SYSTEMS:   ROS As per history of present illness. All pertinent systems were reviewed above. Constitutional, HEENT, cardiovascular, respiratory, GI, GU, musculoskeletal, neuro, psychiatric, endocrine, integumentary and hematologic systems were reviewed and are otherwise negative/unremarkable except for positive findings mentioned above in the HPI.   MEDICATIONS AT HOME:   Prior to Admission medications   Medication Sig Start Date End Date Taking? Authorizing Provider  clopidogrel  (PLAVIX ) 75 MG tablet Take 75 mg by mouth every morning.     [provider]  Dextromethorphan -Quinidine (NUEDEXTA ) 20-10 MG CAPS Take 1 capsule by mouth at bedtime.    [provider]  DULoxetine  (CYMBALTA ) 60 MG capsule Take 60 mg by mouth at bedtime.     [provider]  hydrOXYzine  (VISTARIL ) 50 MG capsule Take 1 capsule (50 mg total) by mouth 3 (three) times daily as needed. 09/22/21   Penn, Reymundo, NP  levETIRAcetam  (KEPPRA ) 500 MG tablet Take 500 mg by mouth 2 (two) times daily.    [provider]  linaclotide  (LINZESS ) 290 MCG CAPS capsule Take 1 capsule (290 mcg total) by mouth daily before breakfast. 08/15/23 02/11/24  [place], PA-C  LORazepam  (ATIVAN ) 0.5 MG tablet Take 0.5 mg by mouth 3 (three) times daily. 07/10/21   [provider]  LORazepam  (ATIVAN ) 0.5 MG tablet Take 1 tablet (0.5 mg total) by  mouth 3 (three) times daily as needed for anxiety. 08/28/21   Wurst, [place], PA-C  pantoprazole  (PROTONIX ) 40 MG tablet Take 40 mg by mouth daily. 12/08/17   [provider]  phenytoin  (DILANTIN ) 50 MG tablet Chew 150 mg by mouth 2 (two) times daily.     [provider]  QUEtiapine  (SEROQUEL ) 400 MG tablet Take 400 mg by mouth at bedtime.    [provider]  senna-docusate (SENOKOT-S) 8.6-50 MG tablet Take 1 tablet by mouth at bedtime. 08/01/21   Bero, Michael M,  MD  simvastatin  (ZOCOR ) 20 MG tablet Take 20 mg by mouth every morning.     [provider]      VITAL SIGNS:  Blood pressure 96/64, pulse 80, temperature 97.9 F (36.6 C), temperature source Oral, resp. rate (!) 23, SpO2 100%.  PHYSICAL EXAMINATION:  Physical Exam  GENERAL:  63 y.o.-year-old patient lying in the bed with no acute distress.  EYES: Pupils equal, round, reactive to light and accommodation. No scleral icterus. Extraocular muscles intact.  HEENT: Head atraumatic, normocephalic. Oropharynx and nasopharynx clear.  NECK:  Supple, no jugular venous distention. No thyroid  enlargement, no tenderness.  LUNGS: Normal breath sounds bilaterally, no wheezing, rales,rhonchi or crepitation. No use of accessory muscles of respiration.  CARDIOVASCULAR: Regular rate and rhythm, S1, S2 normal. No murmurs, rubs, or gallops.  ABDOMEN: Soft, nondistended, nontender. Bowel sounds present. No organomegaly or mass.  EXTREMITIES: No pedal edema, cyanosis, or clubbing.  NEUROLOGIC: Cranial nerves II through XII are intact. Muscle strength 5/5 in all extremities. Sensation intact. Gait not checked.  PSYCHIATRIC: The patient is alert and oriented x 3.  Normal affect and good eye contact. SKIN: No obvious rash, lesion, or ulcer.   LABORATORY PANEL:   CBC Recent Labs  Lab 09/24/24 1930  WBC 13.9*  HGB 11.8*  HCT 34.9*  PLT 119*   ------------------------------------------------------------------------------------------------------------------  Chemistries  Recent Labs  Lab 09/24/24 1930 09/24/24 2110  NA 128*  --   K 3.7  --   CL 95*  --   CO2 18*  --   GLUCOSE 88  --   BUN 85*  --   CREATININE 5.01*  --   CALCIUM 7.7*  --   MG  --  1.6*  AST 24  --   ALT 18  --   ALKPHOS 138*  --   BILITOT 1.9*  --    ------------------------------------------------------------------------------------------------------------------  Cardiac Enzymes No results for input(s):  TROPONINI in the last 168 hours. ------------------------------------------------------------------------------------------------------------------  RADIOLOGY:  CT CHEST ABDOMEN PELVIS WO CONTRAST Result Date: 09/24/2024 CLINICAL DATA:  Sepsis fall on Plavix  vomiting diffuse tenderness hypotensive EXAM: CT CHEST, ABDOMEN AND PELVIS WITHOUT CONTRAST TECHNIQUE: Multidetector CT imaging of the chest, abdomen and pelvis was performed following the standard protocol without IV contrast. RADIATION DOSE REDUCTION: This exam was performed according to the departmental dose-optimization program which includes automated exposure control, adjustment of the mA and/or kV according to patient size and/or use of iterative reconstruction technique. COMPARISON:  None Available. FINDINGS: CT CHEST FINDINGS Cardiovascular: Limited without intravenous contrast. Mild aortic atherosclerosis. No aneurysm. Normal cardiac size. Small pericardial effusion. Mediastinum/Nodes: Patent trachea. No suspicious thyroid  mass. No suspicious lymph nodes. Esophagus within normal limits. Lungs/Pleura: Trace left effusion. See partial atelectasis left base. No pneumothorax. Musculoskeletal: Sternum appears intact. No acute osseous abnormality. CT ABDOMEN PELVIS FINDINGS Hepatobiliary: Cholecystectomy. No focal hepatic abnormality. Slightly enlarged common bile duct measuring up to 11 mm. Pancreas: Unremarkable. No pancreatic ductal dilatation or  surrounding inflammatory changes. Spleen: Negative Adrenals/Urinary Tract: Adrenal glands are normal. Slightly limited assessment due to motion degradation. Mild asymmetrical left perinephric stranding. Suspicion of mild left hydronephrosis but limited due to paucity of intrarenal fat and motion which creates indistinct appearance of the mid to lower pole left kidney. Not much hydroureter but suspicion of 7 mm stone at the level of the mid to distal left ureter at the approximate L4 level, series 3, image  90. Bladder is unremarkable Stomach/Bowel: Stomach within normal limits. No dilated small bowel. No acute bowel wall thickening Vascular/Lymphatic: Aortic atherosclerosis. No enlarged abdominal or pelvic lymph nodes. Reproductive: Left adnexal or uterine mass measuring 3.3 cm on series 3, image 105. Other: Negative for ascites.  No free air Musculoskeletal: No acute or suspicious osseous abnormality. IMPRESSION: 1. Limited without intravenous contrast. 2. Trace left effusion with partial atelectasis left base. 3. Probable mild left hydronephrosis with suspicion of a 7 mm stone at the level of the mid to distal left ureter at the approximate L4 level. 4. Left adnexal or uterine mass/fibroid measuring 3.3 cm. Recommend nonemergent pelvic ultrasound for further evaluation. 5. Aortic atherosclerosis. Aortic Atherosclerosis (ICD10-I70.0). Electronically Signed   By: Luke Bun M.D.   On: 09/24/2024 22:05   CT Cervical Spine Wo Contrast Result Date: 09/24/2024 EXAM: CT CERVICAL SPINE WITHOUT CONTRAST 09/24/2024 09:40:15 PM TECHNIQUE: CT of the cervical spine was performed without the administration of intravenous contrast. Multiplanar reformatted images are provided for review. Automated exposure control, iterative reconstruction, and/or weight based adjustment of the mA/kV was utilized to reduce the radiation dose to as low as reasonably achievable. COMPARISON: None available. CLINICAL HISTORY: Neck trauma, intoxicated or obtunded (Age >= 16y). Table formatting from the original note was not included.; Images from the original note were not included.; Pt arrives POV via wheelchair w/ family c/o low blood pressure and weakness since Saturday, 81/54 @ home today. ; Family found pt in floor bedside her bed early this morning bedside her bed, unsure if she hit her head or LOC. Pt takes plavix . Family reports left ankle noted to be swollen after being found. Pt denies pain. Pt has deficits from previous stroke. NADN.  FINDINGS: CERVICAL SPINE: BONES AND ALIGNMENT: No acute fracture or traumatic malalignment. DEGENERATIVE CHANGES: Mild multilevel spondylosis and facet arthropathy. Prominent anterior osteophytes at C6-C7. No severe spinal canal narrowing. SOFT TISSUES: No prevertebral soft tissue swelling. IMPRESSION: 1. No acute abnormality of the cervical spine . Electronically signed by: Norman Gatlin MD 09/24/2024 09:47 PM EDT RP Workstation: [id number]   CT HEAD WO CONTRAST Result Date: 09/24/2024 EXAM: CT HEAD WITHOUT CONTRAST 09/24/2024 09:40:15 PM TECHNIQUE: CT of the head was performed without the administration of intravenous contrast. Automated exposure control, iterative reconstruction, and/or weight based adjustment of the mA/kV was utilized to reduce the radiation dose to as low as reasonably achievable. COMPARISON: 6 / 19 / 18 CLINICAL HISTORY: Fall. Pt arrives POV via wheelchair w/ family c/o low blood pressure and weakness since Saturday, 81/54 @ home today. Family found pt in floor bedside her bed early this morning, unsure if she hit her head or LOC. Pt takes plavix . Family reports left ankle noted to be swollen after being found. Pt denies pain. Pt has deficits from previous stroke. NADN. FINDINGS: BRAIN AND VENTRICLES: No acute hemorrhage. No evidence of acute infarct. Remote right cerebellar infarct. Cerebellar atrophy. Calcific atherosclerosis. No hydrocephalus. No extra-axial collection. No mass effect or midline shift. ORBITS: No acute abnormality. SINUSES: No acute abnormality.  SOFT TISSUES AND SKULL: No acute soft tissue abnormality. No skull fracture. IMPRESSION: 1. No acute intracranial abnormality. Electronically signed by: Norman Gatlin MD 09/24/2024 09:45 PM EDT RP Workstation: [id number]      IMPRESSION AND PLAN:  Assessment and Plan: No notes have been filed under this hospital service. Service: Hospitalist      DVT prophylaxis: Lovenox***  Advanced Care Planning:  Code Status:  full code***  Family Communication:  The plan of care was discussed in details with the patient (and family). I answered all questions. The patient agreed to proceed with the above mentioned plan. Further management will depend upon hospital course. Disposition Plan: Back to previous home environment Consults called: none***  All the records are reviewed and case discussed with ED provider.  Status is: Inpatient {Inpatient:23812}   At the time of the admission, it appears that the appropriate admission status for this patient is inpatient.  This is judged to be reasonable and necessary in order to provide the required intensity of service to ensure the patient's safety given the presenting symptoms, physical exam findings and initial radiographic and laboratory data in the context of comorbid conditions.  The patient requires inpatient status due to high intensity of service, high risk of further deterioration and high frequency of surveillance required.  I certify that at the time of admission, it is my clinical judgment that the patient will require inpatient hospital care extending more than 2 midnights.                            Dispo: The patient is from: Home              Anticipated d/c is to: Home              Patient currently is not medically stable to d/c.              Difficult to place patient: No  Madison DELENA Peaches M.D on 09/24/2024 at 11:35 PM  [facility]   From 7 PM-7 AM, contact night-coverage [email or web address]  CC: Primary care physician; Bucio, Elsa C, FNP

## 2024-09-24 NOTE — Anesthesia Preprocedure Evaluation (Signed)
 Anesthesia Evaluation  Patient identified by MRN, date of birth, ID band Patient awake    Reviewed: Allergy & Precautions, NPO status , Patient's Chart, lab work & pertinent test results  History of Anesthesia Complications Negative for: history of anesthetic complications  Airway Mallampati: III  TM Distance: <3 FB Neck ROM: full    Dental  (+) Missing   Pulmonary neg pulmonary ROS, neg COPD, former smoker   Pulmonary exam normal        Cardiovascular Exercise Tolerance: Good (-) angina (-) Past MI negative cardio ROS Normal cardiovascular exam     Neuro/Psych Seizures -,  PSYCHIATRIC DISORDERS      CVA, Residual Symptoms    GI/Hepatic negative GI ROS, Neg liver ROS,,,  Endo/Other  negative endocrine ROS    Renal/GU      Musculoskeletal   Abdominal   Peds  Hematology negative hematology ROS (+)   Anesthesia Other Findings Past Medical History: 2014: Menorrhagia with regular cycle     Comment:  Ablation endometrial, 01/02/2013 No date: Seizures (HCC)     Comment:  seizures happened with her stroke, none since 2008: Stroke [facility])  Past Surgical History: No date: CESAREAN SECTION 09/21/2023: COLONOSCOPY WITH PROPOFOL ; N/A     Comment:  Procedure: COLONOSCOPY WITH PROPOFOL ;  Surgeon: Therisa Bi, MD;  Location: [facility] ENDOSCOPY;  Service:               Gastroenterology;  Laterality: N/A; 01/02/2013: DILITATION & CURRETTAGE/HYSTROSCOPY WITH THERMACHOICE  ABLATION     Comment:  Procedure: DILATATION & CURETTAGE/HYSTEROSCOPY WITH               THERMACHOICE ABLATION;  Surgeon: Norleen LULLA Server, MD;                Location: [facility]S;  Service: Gynecology;  Laterality: N/A;              Total Ablation Time = 12 minutes 36 seconds     ; 86-88               deg C 09/21/2023: HEMOSTASIS CLIP PLACEMENT     Comment:  Procedure: HEMOSTASIS CLIP PLACEMENT;  Surgeon: Therisa Bi, MD;  Location: [facility]  ENDOSCOPY;  Service:               Gastroenterology;; 09/21/2023: POLYPECTOMY     Comment:  Procedure: POLYPECTOMY;  Surgeon: Therisa Bi, MD;                Location: [facility] ENDOSCOPY;  Service: Gastroenterology;; No date: TUBAL LIGATION     Reproductive/Obstetrics negative OB ROS                              Anesthesia Physical Anesthesia Plan  ASA: 3  Anesthesia Plan: General ETT   Post-op Pain Management:    Induction: Intravenous  PONV Risk Score and Plan: Ondansetron , Dexamethasone, Midazolam  and Treatment may vary due to age or medical condition  Airway Management Planned: Oral ETT  Additional Equipment:   Intra-op Plan:   Post-operative Plan: Extubation in OR and Possible Post-op intubation/ventilation  Informed Consent: I have reviewed the patients History and Physical, chart, labs and discussed the procedure including the risks, benefits and alternatives for the proposed anesthesia with the patient or authorized  representative who has indicated his/her understanding and acceptance.     Dental Advisory Given  Plan Discussed with: Anesthesiologist, CRNA and Surgeon  Anesthesia Plan Comments: (History and phone consent from the patients son, [name] who is her guardian and power of attorney. His contact - [phone]   Son consented for risks of anesthesia including but not limited to:  - adverse reactions to medications - damage to eyes, teeth, lips or other oral mucosa - nerve damage due to positioning  - sore throat or hoarseness - Damage to heart, brain, nerves, lungs, other parts of body or loss of life  He voiced understanding and assent.)        Anesthesia Quick Evaluation

## 2024-09-24 NOTE — Consult Note (Addendum)
 Urology Consult   I have been asked to see the patient by Dr. Neomi, for evaluation and management of nephrolithiasis.  Chief Complaint: abdominal pain  HPI:  NAELLE DIEGEL is a 63 y.o. year old  ED visit 09/24/24 - diffuse abdominal pain, vomiting Pt largely nonverbal, history of stroke, limited subjective HPI Presented with hypotension 90/48, afebrile, normocardic CT stone - 7mm left mid ureteral stone at iliacs, mild proximal hydronephrosis UA - +LE, 50 WBC, 50 RBC, neg bacteria Cr 5.01 (from 0.7 baseline in 2020), along with hyponatremia to 128, normal lactate, WBC 13.9 Given IV flagyl, cefepime and Vanc, 2L NS in ED  Hx of prior stroke in 2008 (on Plavix ), hx of seizures, aphasia She is entirely nonverbal Son [name] is her guardian and HCPOA    PMH: Past Medical History:  Diagnosis Date   Menorrhagia with regular cycle 2014   Ablation endometrial, 01/02/2013   Seizures (HCC)    seizures happened with her stroke, none since   Stroke [facility]) 2008    Surgical History: Past Surgical History:  Procedure Laterality Date   CESAREAN SECTION     COLONOSCOPY WITH PROPOFOL  N/A 09/21/2023   Procedure: COLONOSCOPY WITH PROPOFOL ;  Surgeon: Therisa Bi, MD;  Location: [facility] ENDOSCOPY;  Service: Gastroenterology;  Laterality: N/A;   DILITATION & CURRETTAGE/HYSTROSCOPY WITH THERMACHOICE ABLATION  01/02/2013   Procedure: DILATATION & CURETTAGE/HYSTEROSCOPY WITH THERMACHOICE ABLATION;  Surgeon: Norleen LULLA Server, MD;  Location: [facility];  Service: Gynecology;  Laterality: N/A;  Total Ablation Time = 12 minutes 36 seconds     ; 86-88 deg C   HEMOSTASIS CLIP PLACEMENT  09/21/2023   Procedure: HEMOSTASIS CLIP PLACEMENT;  Surgeon: Therisa Bi, MD;  Location: [facility] ENDOSCOPY;  Service: Gastroenterology;;   POLYPECTOMY  09/21/2023   Procedure: POLYPECTOMY;  Surgeon: Therisa Bi, MD;  Location: [facility] ENDOSCOPY;  Service: Gastroenterology;;   TUBAL LIGATION      Allergies:  Allergies  Allergen  Reactions   Codeine Nausea And Vomiting    Family History: History reviewed. No pertinent family history.  Social History:  reports that she has quit smoking. She has quit using smokeless tobacco.  Her smokeless tobacco use included chew. She reports that she does not drink alcohol and does not use drugs.  ROS: Negative aside from those stated in the HPI.  Physical Exam: BP 96/64   Pulse 80   Temp 97.7 F (36.5 C) (Oral)   Resp (!) 23   SpO2 100%    Constitutional:  nonverbal, alert, noncommunicative, does not track voice or people Cardiovascular: No clubbing, cyanosis, or edema. Respiratory: Normal respiratory effort, no increased work of breathing. GI: Abdomen is soft, nontender, nondistended, no abdominal masses Lymph: No cervical or inguinal lymphadenopathy. Skin: No rashes, bruises or suspicious lesions. Neurologic: Grossly intact, no focal deficits, moving all 4 extremities. Psychiatric: Normal mood and affect.  Laboratory Data: UA - +LE, 50 WBC, 50 RBC, neg bacteria Cr 5.01 (from 0.7 baseline in 2020), along with hyponatremia to 128, normal lactate, WBC 13.9  Pertinent Imaging: I have personally reviewed the CT Stone - 7mm left mid ureteral stone at iliacs, mild proximal hydronephrosis. No other bilateral nephrolithiasis, no Right hydronephrosis.   Assessment & Plan:   7mm Left obstructive mid ureteral stone with severe AKI + early sepsis Cr 5.0 (from normal baseline), hyponatremic 128, WBC 13.9   Unclear if acute stone with infection is responsible for entire clinical picture, perhaps superimposed on significant dehydration. Limited history obtainable. Will  take to OR for left stent placement considering severe BMP derangements and hypotension.   Recommendations: - Proceed with OR for cystoscopy, Left ureteral stent placement - Obtained informed consent via telephone with son and [place] Lin- witnessed by x2 nurses, hard copy consent signed   Penne JONELLE Skye,  MD   [facility] [address], [address] [place], [place] [address] [phone]

## 2024-09-24 NOTE — Sepsis Progress Note (Signed)
 Elink monitoring for the code sepsis protocol.

## 2024-09-24 NOTE — Op Note (Signed)
 Preoperative diagnosis:  Left ureteral stone   Postoperative diagnosis:  Left ureteral stone    Procedure:  Cystoscopy {Right/left:16020} ureteral stent placement {Right/left:16020} retrograde pyelography with interpretation   Surgeon: Penne Skye, MD  Anesthesia: General  Complications: None  Intraoperative findings:***  EBL: Minimal  Specimens: ***  Indication: Sharon Stevenson is a 63 y.o. patient with ***. After reviewing the management options for treatment, he elected to proceed with the above surgical procedure(s). We have discussed the potential benefits and risks of the procedure, side effects of the proposed treatment, the likelihood of the patient achieving the goals of the procedure, and any potential problems that might occur during the procedure or recuperation. Informed consent has been obtained.  Description of procedure:  The patient was taken to the operating room and general anesthesia was induced.  The patient was placed in the dorsal lithotomy position, prepped and draped in the usual sterile fashion, and preoperative antibiotics were administered. A preoperative time-out was performed.   Cystourethroscopy was performed.  The patient's urethra was examined and was normal. The bladder was then systematically examined in its entirety. There was no evidence for any bladder tumors, stones, or other mucosal pathology.  Bilateral ureteral orifices noted in orthotopic location.  Attention then turned to the {Right/left:16020}ureteral orifice and a ureteral catheter was used to intubate the ureteral orifice.  Omnipaque contrast was injected through the ureteral catheter and a retrograde pyelogram was performed with findings as dictated above.  A sensor guidewire was then advanced up the {Right/left:16020} ureter into the renal pelvis under fluoroscopic guidance.  The wire was then backloaded through the cystoscope and a ureteral stent was advance over the wire using  Seldinger technique.  The stent was positioned appropriately under fluoroscopic and cystoscopic guidance.  The wire was then removed with an adequate stent curl noted in the renal pelvis as well as in the bladder.  The bladder was then emptied and the procedure ended.  The patient appeared to tolerate the procedure well and without complications.  The patient was able to be awakened and transferred to the recovery unit in satisfactory condition.    Penne Skye, M.D.
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
## 2024-09-24 NOTE — Consult Note (Incomplete)
 CODE SEPSIS - PHARMACY COMMUNICATION  **Broad Spectrum Antibiotics should be administered within 1 hour of Sepsis diagnosis**  Time Code Sepsis Called/Page Received: 2050  Antibiotics Ordered: cefepime, flagyl, vancomycin  Time of 1st antibiotic administration: ***  Additional action taken by pharmacy: n/a  If necessary, Name of Provider/Nurse Contacted: n/a    Iasha Mccalister A Miia Blanks ,PharmD Clinical Pharmacist  09/24/2024  8:51 PM

## 2024-09-24 NOTE — ED Notes (Signed)
 Pt refusing to drink juice provide. IV initiated, SEE MAR

## 2024-09-24 NOTE — ED Notes (Signed)
Pt's brief changed and pt repositioned in bed at this time.

## 2024-09-24 NOTE — ED Notes (Signed)
 Pt to CT

## 2024-09-24 NOTE — Consult Note (Signed)
 CODE SEPSIS - PHARMACY COMMUNICATION  **Broad Spectrum Antibiotics should be administered within 1 hour of Sepsis diagnosis**  Time Code Sepsis Called/Page Received: 2050  Antibiotics Ordered: cefepime, flagyl, vancomycin  Time of 1st antibiotic administration: 2117  Additional action taken by pharmacy: n/a  If necessary, Name of Provider/Nurse Contacted: n/a    Will M. Lenon, PharmD, BCPS Clinical Pharmacist 09/24/2024 9:40 PM

## 2024-09-24 NOTE — ED Triage Notes (Addendum)
 Pt arrives POV via wheelchair w/ family c/o low blood pressure and weakness since Saturday, 81/54 @ home today.  Family found pt in floor bedside her bed early this morning bedside her bed, unsure if she hit her head or LOC. Pt takes plavix . Family reports left ankle noted to be swollen after being found. Pt denies pain. Pt has deficits from previous stroke. NADN.

## 2024-09-25 ENCOUNTER — Inpatient Hospital Stay

## 2024-09-25 ENCOUNTER — Encounter: Payer: Self-pay | Admitting: Urology

## 2024-09-25 ENCOUNTER — Inpatient Hospital Stay: Admitting: Anesthesiology

## 2024-09-25 ENCOUNTER — Other Ambulatory Visit: Payer: Self-pay | Admitting: Urology

## 2024-09-25 DIAGNOSIS — G40909 Epilepsy, unspecified, not intractable, without status epilepticus: Secondary | ICD-10-CM

## 2024-09-25 DIAGNOSIS — N179 Acute kidney failure, unspecified: Secondary | ICD-10-CM | POA: Diagnosis not present

## 2024-09-25 DIAGNOSIS — A419 Sepsis, unspecified organism: Secondary | ICD-10-CM | POA: Diagnosis not present

## 2024-09-25 DIAGNOSIS — E785 Hyperlipidemia, unspecified: Secondary | ICD-10-CM | POA: Insufficient documentation

## 2024-09-25 DIAGNOSIS — A4150 Gram-negative sepsis, unspecified: Secondary | ICD-10-CM

## 2024-09-25 DIAGNOSIS — N139 Obstructive and reflux uropathy, unspecified: Secondary | ICD-10-CM | POA: Diagnosis not present

## 2024-09-25 DIAGNOSIS — N201 Calculus of ureter: Secondary | ICD-10-CM

## 2024-09-25 DIAGNOSIS — F419 Anxiety disorder, unspecified: Secondary | ICD-10-CM | POA: Insufficient documentation

## 2024-09-25 LAB — CBC
HCT: 28.3 % — ABNORMAL LOW (ref 36.0–46.0)
Hemoglobin: 9.8 g/dL — ABNORMAL LOW (ref 12.0–15.0)
MCH: 29.1 pg (ref 26.0–34.0)
MCHC: 34.6 g/dL (ref 30.0–36.0)
MCV: 84 fL (ref 80.0–100.0)
Platelets: 109 K/uL — ABNORMAL LOW (ref 150–400)
RBC: 3.37 MIL/uL — ABNORMAL LOW (ref 3.87–5.11)
RDW: 13.1 % (ref 11.5–15.5)
WBC: 14.7 K/uL — ABNORMAL HIGH (ref 4.0–10.5)
nRBC: 0 % (ref 0.0–0.2)

## 2024-09-25 LAB — BASIC METABOLIC PANEL WITH GFR
Anion gap: 12 (ref 5–15)
BUN: 76 mg/dL — ABNORMAL HIGH (ref 8–23)
CO2: 18 mmol/L — ABNORMAL LOW (ref 22–32)
Calcium: 7.4 mg/dL — ABNORMAL LOW (ref 8.9–10.3)
Chloride: 97 mmol/L — ABNORMAL LOW (ref 98–111)
Creatinine, Ser: 4.3 mg/dL — ABNORMAL HIGH (ref 0.44–1.00)
GFR, Estimated: 11 mL/min — ABNORMAL LOW (ref >60)
Glucose, Bld: 134 mg/dL — ABNORMAL HIGH (ref 70–99)
Potassium: 3.9 mmol/L (ref 3.5–5.1)
Sodium: 127 mmol/L — ABNORMAL LOW (ref 135–145)

## 2024-09-25 LAB — PROTIME-INR
INR: 1 (ref 0.8–1.2)
Prothrombin Time: 14 s (ref 11.4–15.2)

## 2024-09-25 LAB — HIV ANTIBODY (ROUTINE TESTING W REFLEX): HIV Screen 4th Generation wRfx: NONREACTIVE

## 2024-09-25 LAB — CORTISOL-AM, BLOOD: Cortisol - AM: 5.9 ug/dL — ABNORMAL LOW (ref 6.7–22.6)

## 2024-09-25 MED ORDER — PHENYLEPHRINE 80 MCG/ML (10ML) SYRINGE FOR IV PUSH (FOR BLOOD PRESSURE SUPPORT)
PREFILLED_SYRINGE | INTRAVENOUS | Status: DC | PRN
  Administered 2024-09-25 (×3): 160 ug via INTRAVENOUS

## 2024-09-25 MED ORDER — IOHEXOL 180 MG/ML  SOLN
INTRAMUSCULAR | Status: DC | PRN
  Administered 2024-09-25: 10 mL

## 2024-09-25 MED ORDER — LIDOCAINE HCL (PF) 2 % IJ SOLN
INTRAMUSCULAR | Status: AC
  Filled 2024-09-25: qty 5

## 2024-09-25 MED ORDER — ONDANSETRON HCL 4 MG/2ML IJ SOLN
INTRAMUSCULAR | Status: AC
  Filled 2024-09-25: qty 2

## 2024-09-25 MED ORDER — FENTANYL CITRATE (PF) 100 MCG/2ML IJ SOLN
INTRAMUSCULAR | Status: AC
  Filled 2024-09-25: qty 2

## 2024-09-25 MED ORDER — OXYCODONE HCL 5 MG PO TABS: 5.0000 mg | ORAL_TABLET | Freq: Once | ORAL | Status: DC | PRN

## 2024-09-25 MED ORDER — SUCCINYLCHOLINE CHLORIDE 200 MG/10ML IV SOSY
PREFILLED_SYRINGE | INTRAVENOUS | Status: DC | PRN
Start: 2024-09-25 — End: 2024-09-25
  Administered 2024-09-25: 120 mg via INTRAVENOUS

## 2024-09-25 MED ORDER — SODIUM CHLORIDE 0.9 % IR SOLN
Status: DC | PRN
  Administered 2024-09-25: 1

## 2024-09-25 MED ORDER — CHLORHEXIDINE GLUCONATE CLOTH 2 % EX PADS
6.0000 | MEDICATED_PAD | Freq: Every day | CUTANEOUS | Status: DC
  Administered 2024-09-25 – 2024-09-27 (×3): 6 via TOPICAL

## 2024-09-25 MED ORDER — MAGNESIUM SULFATE 2 GM/50ML IV SOLN
2.0000 g | Freq: Once | INTRAVENOUS | Status: AC
  Administered 2024-09-25: 2 g via INTRAVENOUS
  Filled 2024-09-25: qty 50

## 2024-09-25 MED ORDER — PROPOFOL 10 MG/ML IV BOLUS
INTRAVENOUS | Status: DC | PRN
  Administered 2024-09-25: 100 mg via INTRAVENOUS

## 2024-09-25 MED ORDER — OXYCODONE HCL 5 MG/5ML PO SOLN: 5.0000 mg | Freq: Once | ORAL | Status: DC | PRN

## 2024-09-25 MED ORDER — MIDAZOLAM HCL 2 MG/2ML IJ SOLN
INTRAMUSCULAR | Status: AC
  Filled 2024-09-25: qty 2

## 2024-09-25 MED ORDER — DEXAMETHASONE SOD PHOSPHATE PF 10 MG/ML IJ SOLN
INTRAMUSCULAR | Status: DC | PRN
  Administered 2024-09-25: 10 mg via INTRAVENOUS

## 2024-09-25 MED ORDER — ENOXAPARIN SODIUM 30 MG/0.3ML IJ SOSY
30.0000 mg | PREFILLED_SYRINGE | INTRAMUSCULAR | Status: DC
  Administered 2024-09-25 – 2024-09-27 (×3): 30 mg via SUBCUTANEOUS
  Filled 2024-09-25 (×3): qty 0.3

## 2024-09-25 MED ORDER — PROPOFOL 10 MG/ML IV BOLUS
INTRAVENOUS | Status: AC
  Filled 2024-09-25: qty 20

## 2024-09-25 MED ORDER — FENTANYL CITRATE (PF) 100 MCG/2ML IJ SOLN: 25.0000 ug | INTRAMUSCULAR | Status: DC | PRN

## 2024-09-25 MED ORDER — LIDOCAINE HCL (CARDIAC) PF 100 MG/5ML IV SOSY
PREFILLED_SYRINGE | INTRAVENOUS | Status: DC | PRN
Start: 2024-09-25 — End: 2024-09-25
  Administered 2024-09-25: 60 mg via INTRAVENOUS

## 2024-09-25 NOTE — Plan of Care (Signed)

## 2024-09-25 NOTE — Transfer of Care (Signed)
 Immediate Anesthesia Transfer of Care Note  Patient: Sharon Stevenson  Procedure(s) Performed: CYSTOSCOPY, RIGID, WITH STENT REPLACEMENT (Left)  Patient Location: PACU  Anesthesia Type:General  Level of Consciousness: drowsy  Airway & Oxygen Therapy: Patient Spontanous Breathing and Patient connected to face mask oxygen  Post-op Assessment: Report given to RN and Post -op Vital signs reviewed and stable  Post vital signs: Reviewed and stable  Last Vitals:  Vitals Value Taken Time  BP 99/63 09/25/24 00:45  Temp 36.2 C 09/25/24 00:45  Pulse 69 09/25/24 00:49  Resp 15 09/25/24 00:49  SpO2 98 % 09/25/24 00:49  Vitals shown include unfiled device data.  Last Pain:  Vitals:   09/24/24 2335  TempSrc: Oral  PainSc:          Complications: No notable events documented.

## 2024-09-25 NOTE — Assessment & Plan Note (Signed)
-   Will continue Seroquel.

## 2024-09-25 NOTE — Assessment & Plan Note (Signed)
-   Sepsis is manifested by leukocytosis and tachycardia. - The patient will be continued on IV Rocephin . - Will continue hydration with IV normal saline. - Will follow blood and urine cultures. - She may meet severe sepsis manifestation given hypotension.

## 2024-09-25 NOTE — Assessment & Plan Note (Signed)
-   Will continue Ativan , Seroquel  and Cymbalta .

## 2024-09-25 NOTE — OR Nursing (Signed)
 Patient is nonverbal. Spoke with son, [name] via telephone with PACU RN, Dr. Georganne and Dr. Stevan to verify identity and consent for surgical procedure. Son states Sharon Stevenson needs assistance with feeding and daily ADL's.

## 2024-09-25 NOTE — Anesthesia Procedure Notes (Signed)
 Procedure Name: Intubation Date/Time: 09/25/2024 12:24 AM  Performed by: Delores Evalene [name], CRNAPre-anesthesia Checklist: Patient identified, Patient being monitored, Timeout performed, Emergency Drugs available and Suction available Patient Re-evaluated:Patient Re-evaluated prior to induction Oxygen Delivery Method: Circle system utilized Preoxygenation: Pre-oxygenation with 100% oxygen Induction Type: IV induction and Rapid sequence Laryngoscope Size: 3 and McGrath Grade View: Grade I Tube type: Oral Tube size: 7.0 mm Number of attempts: 1 Airway Equipment and Method: Stylet and Video-laryngoscopy Placement Confirmation: ETT inserted through vocal cords under direct vision, positive ETCO2 and breath sounds checked- equal and bilateral Secured at: 21 cm Tube secured with: Tape Dental Injury: Teeth and Oropharynx as per pre-operative assessment

## 2024-09-25 NOTE — Assessment & Plan Note (Signed)
-   The patient will be admitted to a designated bed. - She is status post left ureteral stent placement for obstructive uropathy. - Will continue antibiotic therapy with IV Rocephin . - Urology consult and follow-up was obtained by Dr. Georganne. - Pain management will be provided. - She will be hydrated with IV normal saline.

## 2024-09-25 NOTE — Anesthesia Postprocedure Evaluation (Signed)
 Anesthesia Post Note  Patient: Sharon Stevenson  Procedure(s) Performed: CYSTOSCOPY, RIGID, WITH STENT REPLACEMENT (Left)  Patient location during evaluation: PACU Anesthesia Type: General Level of consciousness: awake and alert Pain management: pain level controlled Vital Signs Assessment: post-procedure vital signs reviewed and stable Respiratory status: spontaneous breathing, nonlabored ventilation and respiratory function stable Cardiovascular status: blood pressure returned to baseline and stable Postop Assessment: no apparent nausea or vomiting Anesthetic complications: no   No notable events documented.   Last Vitals:  Vitals:   09/25/24 0435 09/25/24 0551  BP: (!) 100/58   Pulse: 76 79  Resp:    Temp:    SpO2:  99%    Last Pain:  Vitals:   09/25/24 0139  TempSrc: Oral  PainSc: 0-No pain                 Fairy POUR Xcaret Morad

## 2024-09-25 NOTE — Hospital Course (Addendum)
 Hospital course / significant events:   HPI: Sharon Stevenson is a 63 y.o. Caucasian female with medical history significant for seizure disorder and CVA who presented to the emergency room with generalized weakness - acute, assoc w/ vomiting x1, ufinafy frequency/dysuria.   10/20: to ED. BP 90/48, RR 22, WBC 13.9, hyponatremia 128. Received fluids 2L bolus, abx vanc + cefepime + flagyl. UA (+)UTI. CT C/A/P mild L hydronephrosis w/ concern for 7mm stone mid/distal L ureter. Urology consult and admit to hospitalist. Underwent cystoscopy, left ureteral stent placement, left retrograde pyelography with interpretation, and Foley catheter placement.  10/21: pending UCx, BCx NG thus far.      Consultants:  Urology   Procedures/Surgeries: 09/24/24 cystoscopy, left ureteral stent placement, left retrograde pyelography with interpretation, and Foley catheter placement w/ Dr Georganne urology       ASSESSMENT & PLAN:   Sepsis d/t UTI UTI complicated by ureterolithiasis and hydronephrosis see below  Abx continue ceftriaxone  Await cultures   Acute unilateral obstructive uropathy  L hydronephrosis, 7 mm stone mid to distal left ureter. S/p L ureter stent 09/25/24  Urology on board  outpatient follow up and definitive stone treatment in ~2-3 weeks   Foley in place in OR Can remove when appropriate - await cultures / clinical improvement   Left adnexal or uterine mass/fibroid measuring 3.3 cm. NO clinical significance to current hospitalization  nonemergent pelvic ultrasound for further evaluation outpatient   Hx CVA  Residual deficits - bedbound, minimally verbal, contracted Statin  Aortic atherosclerosis PVD  HLD Statin Clopidogrel     Mental health  Dextromethorphan -quinidine Duloxetine  Hydroxyzine  Quetiapine   Lorazepam  scheduled and prn outpatient   Seizure disorder  Keppra  Phenytoin  Lorazepam  scheduled and prn outpatient   Chronic constipation Linzess   Senna-docusate    GERD  PPI  Overweight / class 1 obesity based on BMI: Body mass index is 29.45 kg/m.SABRA Significantly low or high BMI is associated with higher medical risk.  Underweight - under 18  overweight - 25 to 29 obese - 30 or more Class 1 obesity: BMI of 30.0 to 34 Class 2 obesity: BMI of 35.0 to 39 Class 3 obesity: BMI of 40.0 to 49 Super Morbid Obesity: BMI 50-59 Super-super Morbid Obesity: BMI 60+ Healthy nutrition and physical activity advised as adjunct to other disease management and risk reduction treatments    DVT prophylaxis: lovenox IV fluids: no continuous IV fluids  Nutrition: regular Central lines / other devices: none  Code Status: FULL CODE ACP documentation reviewed:  none on file in [facility]  TOC needs: none - expect home to family's care Medical barriers to dispo: await cultures. Expected medical readiness for discharge rtomrorow .
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
## 2024-09-25 NOTE — Assessment & Plan Note (Signed)
 Will continue statin therapy

## 2024-09-25 NOTE — Assessment & Plan Note (Signed)
-   Will continue Keppra  and phenytoin .

## 2024-09-25 NOTE — Progress Notes (Signed)
 Surgical Physician Order Form [facility] [place]  * Scheduling expectation : 2 to 3 weeks   *Length of Case:   *Clearance needed: ?, will need to ask Dr. Georganne if he needs her to stop the Plavix  for the procedure   *Anticoagulation Instructions: TBD  *Aspirin Instructions: TBD  *Post-op visit Date/Instructions:  TBD  *Diagnosis: Left Ureteral Stone  *Procedure: left  Ureteroscopy w/laser lithotripsy & stent exchange (47643)   Additional orders: N/A  -Admit type: OUTpatient  -Anesthesia: General  -VTE Prophylaxis Standing Order SCD's       Other:   -Standing Lab Orders Per Anesthesia    Lab other: UA&Urine Culture  -Standing Test orders EKG/Chest x-ray per Anesthesia       Test other:   - Medications:  Ancef  2gm IV  -Other orders:  N/A

## 2024-09-26 DIAGNOSIS — N139 Obstructive and reflux uropathy, unspecified: Secondary | ICD-10-CM | POA: Diagnosis not present

## 2024-09-26 LAB — BLOOD CULTURE ID PANEL (REFLEXED) - BCID2

## 2024-09-26 MED ORDER — SODIUM CHLORIDE 0.9 % IV SOLN
2.0000 g | INTRAVENOUS | Status: DC
  Administered 2024-09-27 (×2): 2 g via INTRAVENOUS
  Filled 2024-09-26 (×2): qty 20

## 2024-09-26 MED ORDER — ORAL CARE MOUTH RINSE: 15.0000 mL | OROMUCOSAL | Status: DC | PRN

## 2024-09-26 NOTE — Progress Notes (Signed)
 PHARMACY - PHYSICIAN COMMUNICATION CRITICAL VALUE ALERT - BLOOD CULTURE IDENTIFICATION (BCID)  Sharon Stevenson is an 63 y.o. female who presented to [facility] on 09/24/2024 with a chief complaint of uropathy.   Assessment:  E Coli in 1 of 4 bottles , no resistance detected  (include suspected source if known)  Name of physician (or Provider) Contacted: [place]  Current antibiotics: Ceftriaxone  1 gm IV Q24H   Changes to prescribed antibiotics recommended:  Recommendations accepted by provider  - Will change dose to Ceftriaxone  2 gm IV Q24H   Results for orders placed or performed during the hospital encounter of 09/24/24  Blood Culture ID Panel (Reflexed) (Collected: 09/24/2024  9:11 PM)  Result Value Ref Range   Enterococcus faecalis NOT DETECTED NOT DETECTED   Enterococcus Faecium NOT DETECTED NOT DETECTED   Listeria monocytogenes NOT DETECTED NOT DETECTED   Staphylococcus species NOT DETECTED NOT DETECTED   Staphylococcus aureus (BCID) NOT DETECTED NOT DETECTED   Staphylococcus epidermidis NOT DETECTED NOT DETECTED   Staphylococcus lugdunensis NOT DETECTED NOT DETECTED   Streptococcus species NOT DETECTED NOT DETECTED   Streptococcus agalactiae NOT DETECTED NOT DETECTED   Streptococcus pneumoniae NOT DETECTED NOT DETECTED   Streptococcus pyogenes NOT DETECTED NOT DETECTED   A.calcoaceticus-baumannii NOT DETECTED NOT DETECTED   Bacteroides fragilis NOT DETECTED NOT DETECTED   Enterobacterales DETECTED (A) NOT DETECTED   Enterobacter cloacae complex NOT DETECTED NOT DETECTED   Escherichia coli DETECTED (A) NOT DETECTED   Klebsiella aerogenes NOT DETECTED NOT DETECTED   Klebsiella oxytoca NOT DETECTED NOT DETECTED   Klebsiella pneumoniae NOT DETECTED NOT DETECTED   Proteus species NOT DETECTED NOT DETECTED   Salmonella species NOT DETECTED NOT DETECTED   Serratia marcescens NOT DETECTED NOT DETECTED   Haemophilus influenzae NOT DETECTED NOT DETECTED   Neisseria meningitidis NOT  DETECTED NOT DETECTED   Pseudomonas aeruginosa NOT DETECTED NOT DETECTED   Stenotrophomonas maltophilia NOT DETECTED NOT DETECTED   Candida albicans NOT DETECTED NOT DETECTED   Candida auris NOT DETECTED NOT DETECTED   Candida glabrata NOT DETECTED NOT DETECTED   Candida krusei NOT DETECTED NOT DETECTED   Candida parapsilosis NOT DETECTED NOT DETECTED   Candida tropicalis NOT DETECTED NOT DETECTED   Cryptococcus neoformans/gattii NOT DETECTED NOT DETECTED   CTX-M ESBL NOT DETECTED NOT DETECTED   Carbapenem resistance IMP NOT DETECTED NOT DETECTED   Carbapenem resistance KPC NOT DETECTED NOT DETECTED   Carbapenem resistance NDM NOT DETECTED NOT DETECTED   Carbapenem resist OXA 48 LIKE NOT DETECTED NOT DETECTED   Carbapenem resistance VIM NOT DETECTED NOT DETECTED    [name] D 09/26/2024  11:42 PM

## 2024-09-26 NOTE — TOC CM/SW Note (Signed)
 Transition of Care [facility]) - Inpatient Brief Assessment   Patient Details  Name: Sharon Stevenson MRN: [id number] Date of Birth: 04/29/1961  [facility] CM/SW Contact:    Lauraine JAYSON Carpen, LCSW Phone Number: 09/26/2024, 12:29 PM   Clinical Narrative: CSW reviewed chart. No TOC needs identified. CSW will continue to follow progress. Please place [facility] consult if any needs arise.  Transition of Care Asessment: Insurance and Status: Insurance coverage has been reviewed Patient has primary care physician: Yes Home environment has been reviewed: Apartment Prior level of function:: Not documented Prior/Current Home Services: No current home services Social Drivers of Health Review: SDOH reviewed no interventions necessary Readmission risk has been reviewed: Yes Transition of care needs: no transition of care needs at this time

## 2024-09-26 NOTE — Progress Notes (Signed)
 Progress Note   Patient: Sharon Stevenson FMW:[id number] DOB: [birth year] DOA: 09/24/2024     2 DOS: the patient was seen and examined on 09/26/2024   Brief hospital course:   HPI: Sharon Stevenson is a 63 y.o. Caucasian female with medical history significant for seizure disorder and CVA who presented to the emergency room with generalized weakness - acute, assoc w/ vomiting x1, ufinafy frequency/dysuria.    10/20: to ED. BP 90/48, RR 22, WBC 13.9, hyponatremia 128. Received fluids 2L bolus, abx vanc + cefepime + flagyl. UA (+)UTI. CT C/A/P mild L hydronephrosis w/ concern for 7mm stone mid/distal L ureter. Urology consult and admit to hospitalist. Underwent cystoscopy, left ureteral stent placement, left retrograde pyelography with interpretation, and Foley catheter placement.  10/21: pending UCx, BCx NG thus far.     Consultants:  Urology    Procedures/Surgeries: 09/24/24 cystoscopy, left ureteral stent placement, left retrograde pyelography with interpretation, and Foley catheter placement w/ Dr Georganne urology    ASSESSMENT & PLAN:   Sepsis d/t UTI UTI complicated by ureterolithiasis and hydronephrosis see below  Abx continue ceftriaxone  Await cultures    Acute unilateral obstructive uropathy  L hydronephrosis, 7 mm stone mid to distal left ureter. S/p L ureter stent 09/25/24  Urology on board  outpatient follow up and definitive stone treatment in ~2-3 weeks    Foley in place in OR Can remove when appropriate - await cultures / clinical improvement    Left adnexal or uterine mass/fibroid measuring 3.3 cm. NO clinical significance to current hospitalization  nonemergent pelvic ultrasound for further evaluation outpatient    Hx CVA  Residual deficits - bedbound, minimally verbal, contracted Statin   Aortic atherosclerosis PVD  HLD Statin Clopidogrel     Mental health  Dextromethorphan -quinidine Duloxetine  Hydroxyzine  Quetiapine   Lorazepam  scheduled and prn outpatient     Seizure disorder  Keppra  Phenytoin  Lorazepam  scheduled and prn outpatient    Chronic constipation Linzess   Senna-docusate    GERD  PPI   Overweight / class 1 obesity based on BMI: Body mass index is 29.45 kg/m. Healthy nutrition and physical activity advised as adjunct to other disease management and risk reduction treatments       DVT prophylaxis: lovenox   Code Status: FULL CODE    TOC needs: none - expect home to family's care Medical barriers to dispo: await cultures.  Subjective / Brief ROS:  Admits to improvement, several pills of medication found in patient's room likely indicative of patient pocketing  Physical Exam Constitutional:      General: She is not in acute distress. Cardiovascular:     Rate and Rhythm: Normal rate and regular rhythm.  Pulmonary:     Effort: Pulmonary effort is normal.     Breath sounds: Normal breath sounds.  Neurological:     Mental Status: She is alert. Mental status is at baseline.  Psychiatric:        Mood and Affect: Mood normal.        Behavior: Behavior normal.    Data Reviewed:    Latest Ref Rng & Units 09/25/2024    6:12 AM 09/24/2024    7:30 PM 01/27/2019    7:00 PM  CBC  WBC 4.0 - 10.5 K/uL 14.7  13.9  8.1   Hemoglobin 12.0 - 15.0 g/dL 9.8  88.1  86.1   Hematocrit 36.0 - 46.0 % 28.3  34.9  44.7   Platelets 150 - 400 K/uL 109  119  351  Latest Ref Rng & Units 09/25/2024    6:12 AM 09/24/2024    7:30 PM 01/27/2019    7:00 PM  BMP  Glucose 70 - 99 mg/dL 865  88  96   BUN 8 - 23 mg/dL 76  85  14   Creatinine 0.44 - 1.00 mg/dL 5.69  4.98  9.30   Sodium 135 - 145 mmol/L 127  128  139   Potassium 3.5 - 5.1 mmol/L 3.9  3.7  3.5   Chloride 98 - 111 mmol/L 97  95  105   CO2 22 - 32 mmol/L 18  18  26    Calcium 8.9 - 10.3 mg/dL 7.4  7.7  9.0       Vitals:   09/26/24 0431 09/26/24 0439 09/26/24 0908 09/26/24 1308  BP: 130/73  108/65 109/66  Pulse: 75  80 76  Resp: 17 19 18 18   Temp: 98.2 F (36.8 C)   97.9 F (36.6 C) 97.7 F (36.5 C)  TempSrc: Oral     SpO2: 96%  98% 99%  Weight:      Height:        Author: Drue ONEIDA Potter, MD 09/26/2024 4:37 PM  For on call review [email or web address].

## 2024-09-26 NOTE — Progress Notes (Signed)
 Multiple disintegrated pills found in pt's bed. It appears pt is pocketing pills, please ensure pt's mouth is clear after each medication pass.

## 2024-09-27 DIAGNOSIS — N139 Obstructive and reflux uropathy, unspecified: Secondary | ICD-10-CM | POA: Diagnosis not present

## 2024-09-27 LAB — CBC WITH DIFFERENTIAL/PLATELET
Abs Immature Granulocytes: 0.32 K/uL — ABNORMAL HIGH (ref 0.00–0.07)
Basophils Absolute: 0 K/uL (ref 0.0–0.1)
Basophils Relative: 0 %
Eosinophils Absolute: 0.2 K/uL (ref 0.0–0.5)
Eosinophils Relative: 1 %
HCT: 27.9 % — ABNORMAL LOW (ref 36.0–46.0)
Hemoglobin: 9.6 g/dL — ABNORMAL LOW (ref 12.0–15.0)
Immature Granulocytes: 3 %
Lymphocytes Relative: 13 %
Lymphs Abs: 1.6 K/uL (ref 0.7–4.0)
MCH: 29.7 pg (ref 26.0–34.0)
MCHC: 34.4 g/dL (ref 30.0–36.0)
MCV: 86.4 fL (ref 80.0–100.0)
Monocytes Absolute: 0.9 K/uL (ref 0.1–1.0)
Monocytes Relative: 7 %
Neutro Abs: 9.5 K/uL — ABNORMAL HIGH (ref 1.7–7.7)
Neutrophils Relative %: 76 %
Platelets: 278 K/uL (ref 150–400)
RBC: 3.23 MIL/uL — ABNORMAL LOW (ref 3.87–5.11)
RDW: 13.3 % (ref 11.5–15.5)
WBC: 12.5 K/uL — ABNORMAL HIGH (ref 4.0–10.5)
nRBC: 0 % (ref 0.0–0.2)

## 2024-09-27 LAB — BASIC METABOLIC PANEL WITH GFR
Anion gap: 10 (ref 5–15)
BUN: 77 mg/dL — ABNORMAL HIGH (ref 8–23)
CO2: 21 mmol/L — ABNORMAL LOW (ref 22–32)
Calcium: 7.7 mg/dL — ABNORMAL LOW (ref 8.9–10.3)
Chloride: 108 mmol/L (ref 98–111)
Creatinine, Ser: 4.55 mg/dL — ABNORMAL HIGH (ref 0.44–1.00)
GFR, Estimated: 10 mL/min — ABNORMAL LOW (ref >60)
Glucose, Bld: 79 mg/dL (ref 70–99)
Potassium: 3.8 mmol/L (ref 3.5–5.1)
Sodium: 139 mmol/L (ref 135–145)

## 2024-09-27 NOTE — Progress Notes (Signed)
 Progress Note   Patient: Sharon Stevenson FMW:[id number] DOB: 1960/12/17 DOA: 09/24/2024     3 DOS: the patient was seen and examined on 09/27/2024   Brief hospital course:   From HPI: Sharon Stevenson is a 63 y.o. Caucasian female with medical history significant for seizure disorder and CVA who presented to the emergency room with generalized weakness - acute, assoc w/ vomiting x1, ufinafy frequency/dysuria.    10/20: to ED. BP 90/48, RR 22, WBC 13.9, hyponatremia 128. Received fluids 2L bolus, abx vanc + cefepime + flagyl. UA (+)UTI. CT C/A/P mild L hydronephrosis w/ concern for 7mm stone mid/distal L ureter. Urology consult and admit to hospitalist. Underwent cystoscopy, left ureteral stent placement, left retrograde pyelography with interpretation, and Foley catheter placement.  10/21: pending UCx, BCx NG thus far.       Consultants:  Urology    Procedures/Surgeries: 09/24/24 cystoscopy, left ureteral stent placement, left retrograde pyelography with interpretation, and Foley catheter placement w/ Dr Georganne urology    ASSESSMENT & PLAN:   Sepsis d/t UTI UTI complicated by ureterolithiasis and hydronephrosis see below  Abx continue ceftriaxone  Urine culture showing Proteus mirabilis as well as E. Coli Sensitivity pending  Acute unilateral obstructive uropathy  L hydronephrosis, 7 mm stone mid to distal left ureter. S/p L ureter stent 09/25/24  Urology on board  outpatient follow up and definitive stone treatment in ~2-3 weeks    Foley in place in OR Order placed to discontinue Foley for trial of voiding   Left adnexal or uterine mass/fibroid measuring 3.3 cm. NO clinical significance to current hospitalization  nonemergent pelvic ultrasound for further evaluation outpatient    Hx CVA  Residual deficits - bedbound, minimally verbal, contracted Statin   Aortic atherosclerosis PVD  HLD Statin Clopidogrel     Mental health   Dextromethorphan -quinidine Duloxetine  Hydroxyzine  Quetiapine   Lorazepam  scheduled and prn outpatient    Seizure disorder  Keppra  Phenytoin  Lorazepam  scheduled and prn outpatient    Chronic constipation Linzess   Senna-docusate    GERD  PPI   Overweight / class 1 obesity based on BMI: Body mass index is 29.45 kg/m. Healthy nutrition and physical activity advised as adjunct to other disease management and risk reduction treatments     DVT prophylaxis: lovenox   Code Status: FULL CODE     TOC needs: PT OT consulted   Subjective / Brief ROS:  Patient seen and examined at bedside this morning Appears chronically ill PT OT consulted as it appears patient may not be able to go home TOC notified   Physical Exam Constitutional:      General: She is not in acute distress. Cardiovascular:     Rate and Rhythm: Normal rate and regular rhythm.  Pulmonary:     Effort: Pulmonary effort is normal.     Breath sounds: Normal breath sounds.  Neurological:     Mental Status: She is alert. Mental status is at baseline.  Psychiatric:        Mood and Affect: Mood normal.        Behavior: Behavior normal.    Data Reviewed:   Vitals:   09/27/24 0412 09/27/24 0757 09/27/24 1134 09/27/24 1510  BP: 115/71 (!) 140/70 102/65 122/64  Pulse: 73 77 79 79  Resp: 20 20 20 20   Temp: 98 F (36.7 C) 97.9 F (36.6 C) 98.8 F (37.1 C) 98.6 F (37 C)  TempSrc:  Axillary Oral Oral  SpO2: 96% 98% 100% 97%  Weight:  Height:          Latest Ref Rng & Units 09/27/2024    6:59 AM 09/25/2024    6:12 AM 09/24/2024    7:30 PM  BMP  Glucose 70 - 99 mg/dL 79  865  88   BUN 8 - 23 mg/dL 77  76  85   Creatinine 0.44 - 1.00 mg/dL 5.44  5.69  4.98   Sodium 135 - 145 mmol/L 139  127  128   Potassium 3.5 - 5.1 mmol/L 3.8  3.9  3.7   Chloride 98 - 111 mmol/L 108  97  95   CO2 22 - 32 mmol/L 21  18  18    Calcium 8.9 - 10.3 mg/dL 7.7  7.4  7.7        Latest Ref Rng & Units 09/27/2024     6:59 AM 09/25/2024    6:12 AM 09/24/2024    7:30 PM  CBC  WBC 4.0 - 10.5 K/uL 12.5  14.7  13.9   Hemoglobin 12.0 - 15.0 g/dL 9.6  9.8  88.1   Hematocrit 36.0 - 46.0 % 27.9  28.3  34.9   Platelets 150 - 400 K/uL 278  109  119      Author: Drue ONEIDA Potter, MD 09/27/2024 4:57 PM  For on call review [email or web address].

## 2024-09-28 ENCOUNTER — Other Ambulatory Visit: Payer: Self-pay

## 2024-09-28 DIAGNOSIS — N139 Obstructive and reflux uropathy, unspecified: Secondary | ICD-10-CM | POA: Diagnosis not present

## 2024-09-28 LAB — URINE CULTURE: Culture: 30000 — AB

## 2024-09-28 LAB — CBC WITH DIFFERENTIAL/PLATELET
Abs Immature Granulocytes: 0.42 K/uL — ABNORMAL HIGH (ref 0.00–0.07)
Basophils Absolute: 0.1 K/uL (ref 0.0–0.1)
Basophils Relative: 1 %
Eosinophils Absolute: 0.2 K/uL (ref 0.0–0.5)
Eosinophils Relative: 2 %
HCT: 29.7 % — ABNORMAL LOW (ref 36.0–46.0)
Hemoglobin: 9.8 g/dL — ABNORMAL LOW (ref 12.0–15.0)
Immature Granulocytes: 4 %
Lymphocytes Relative: 16 %
Lymphs Abs: 1.7 K/uL (ref 0.7–4.0)
MCH: 29.1 pg (ref 26.0–34.0)
MCHC: 33 g/dL (ref 30.0–36.0)
MCV: 88.1 fL (ref 80.0–100.0)
Monocytes Absolute: 0.9 K/uL (ref 0.1–1.0)
Monocytes Relative: 8 %
Neutro Abs: 7.7 K/uL (ref 1.7–7.7)
Neutrophils Relative %: 69 %
Platelets: 356 K/uL (ref 150–400)
RBC: 3.37 MIL/uL — ABNORMAL LOW (ref 3.87–5.11)
RDW: 13.6 % (ref 11.5–15.5)
WBC: 11 K/uL — ABNORMAL HIGH (ref 4.0–10.5)
nRBC: 0 % (ref 0.0–0.2)

## 2024-09-28 LAB — BASIC METABOLIC PANEL WITH GFR
Anion gap: 16 — ABNORMAL HIGH (ref 5–15)
BUN: 68 mg/dL — ABNORMAL HIGH (ref 8–23)
CO2: 20 mmol/L — ABNORMAL LOW (ref 22–32)
Calcium: 8.2 mg/dL — ABNORMAL LOW (ref 8.9–10.3)
Chloride: 106 mmol/L (ref 98–111)
Creatinine, Ser: 4.23 mg/dL — ABNORMAL HIGH (ref 0.44–1.00)
GFR, Estimated: 11 mL/min — ABNORMAL LOW (ref >60)
Glucose, Bld: 67 mg/dL — ABNORMAL LOW (ref 70–99)
Potassium: 4.2 mmol/L (ref 3.5–5.1)
Sodium: 142 mmol/L (ref 135–145)

## 2024-09-28 MED ORDER — CEPHALEXIN 500 MG PO CAPS: 500.0000 mg | ORAL_CAPSULE | Freq: Two times a day (BID) | ORAL | Status: DC

## 2024-09-28 MED ORDER — CEPHALEXIN 500 MG PO CAPS
500.0000 mg | ORAL_CAPSULE | Freq: Three times a day (TID) | ORAL | 0 refills | Status: AC
  Filled 2024-09-28: qty 30, 10d supply, fill #0

## 2024-09-28 MED ORDER — CEPHALEXIN 500 MG PO CAPS: 500.0000 mg | ORAL_CAPSULE | Freq: Two times a day (BID) | ORAL | 0 refills | Status: DC

## 2024-09-28 NOTE — Evaluation (Signed)
 Occupational Therapy Evaluation Patient Details Name: Sharon Stevenson MRN: [id number] DOB: 06/29/1961 Today's Date: 09/28/2024   History of Present Illness   Pt is a 63 y.o. female PMH of seizure disorder and CVA who presented to the emergency room with generalized weakness - acute, assoc w/ vomiting x1, ufinafy frequency/dysuria. Admitted for management of sepsis d/t UTI, Acute unilateral obstructive uropathy, L hydronephrosis, 7 mm stone mid to distal left ureter, S/p L ureter stent 09/25/24.     Clinical Impressions Pt was seen for OT evaluation this date. Pt is non-verbal, but is able to nod/moan in agreement to questions. Per chart review and pt agreement she lives at home with her son who assists with all ADLs and stand pivot transfers to the wheelchair. Pt appears to likely be at or near her baseline function. She required Min to mod I for bed mobility tasks via verbal cues. She was able to perform STS and stand pivot transfer from EOB<>wheelchair with Min A x1 and cues for hand placement/safety. Pt wishing to return to bed and impulsively returned herself to supine. Noted with soiled brief, but able to roll with MOD I for removal, total care for peri-care. Left in bed with alarm on and all needs in reach with nurse present. No further acute OT services warranted with no need for follow up therapy.      If plan is discharge home, recommend the following:   A little help with walking and/or transfers;A lot of help with bathing/dressing/bathroom     Functional Status Assessment   Patient has not had a recent decline in their functional status     Equipment Recommendations   None recommended by OT;Other (comment) (has needed DME)     Recommendations for Other Services         Precautions/Restrictions   Precautions Precautions: Fall Recall of Precautions/Restrictions: Impaired Restrictions Weight Bearing Restrictions Per Provider Order: No     Mobility Bed  Mobility Overal bed mobility: Needs Assistance Bed Mobility: Supine to Sit, Sit to Supine, Rolling Rolling: Modified independent (Device/Increase time)   Supine to sit: Min assist Sit to supine: Supervision   General bed mobility comments: min a for trunkal elevation and cues for initaition of BLE management to reach EOB; able to return to supine with supervision for safety d/t impulsivity    Transfers Overall transfer level: Needs assistance Equipment used: 1 person hand held assist Transfers: Sit to/from Stand, Bed to chair/wheelchair/BSC Sit to Stand: Min assist Stand pivot transfers: Min assist         General transfer comment: multimodal cues for safety and sequencing of transfers      Balance Overall balance assessment: Needs assistance   Sitting balance-Leahy Scale: Fair Sitting balance - Comments: CGA to static sit at EOB with occasional min A d/t leaning to lay down   Standing balance support: Single extremity supported, During functional activity Standing balance-Leahy Scale: Poor Standing balance comment: constant Min A for support during transfers                           ADL either performed or assessed with clinical judgement   ADL Overall ADL's : Needs assistance/impaired                         Toilet Transfer: Minimal assistance;Stand-pivot Toilet Transfer Details (indicate cue type and reason): simulated to W/C with Min A x1 Toileting- Clothing Manipulation and  Hygiene: Maximal assistance;Bed level Toileting - Clothing Manipulation Details (indicate cue type and reason): pt able to bridge and roll in bed MOD I for total assist peri-care and brief change             Vision         Perception         Praxis         Pertinent Vitals/Pain Pain Assessment Pain Assessment: No/denies pain     Extremity/Trunk Assessment Upper Extremity Assessment Upper Extremity Assessment: Overall WFL for tasks assessed   Lower  Extremity Assessment Lower Extremity Assessment: Generalized weakness       Communication Communication Communication: Impaired Factors Affecting Communication: Difficulty expressing self (non-verbal)   Cognition Arousal: Alert Behavior During Therapy: Impulsive Cognition: History of cognitive impairments             OT - Cognition Comments: pt is non-verbal, but can moan/nod to questions                 Following commands: Impaired Following commands impaired: Follows one step commands with increased time     Cueing  General Comments   Cueing Techniques: Verbal cues;Tactile cues;Gestural cues      Exercises     Shoulder Instructions      Home Living Family/patient expects to be discharged to:: Private residence Living Arrangements: Children (son and his significant other) Available Help at Discharge: Family                                    Prior Functioning/Environment Prior Level of Function : Needs assist  Cognitive Assist : Mobility (cognitive);ADLs (cognitive)     Physical Assist : Mobility (physical);ADLs (physical) Mobility (physical): Transfers;Bed mobility ADLs (physical): Feeding;Grooming;Dressing;Toileting;Bathing;IADLs Mobility Comments: per chart review, pt utilizes a W/C for mobility at home, pt agrees to this reports she has not walked in a long time ADLs Comments: per chart son assists with all ADLs with pt agreeing to this during eval    OT Problem List:     OT Treatment/Interventions:        OT Goals(Current goals can be found in the care plan section)       OT Frequency:       Co-evaluation              AM-PAC OT 6 Clicks Daily Activity     Outcome Measure Help from another person eating meals?: A Lot Help from another person taking care of personal grooming?: A Lot Help from another person toileting, which includes using toliet, bedpan, or urinal?: Total Help from another person bathing  (including washing, rinsing, drying)?: Total Help from another person to put on and taking off regular upper body clothing?: A Lot Help from another person to put on and taking off regular lower body clothing?: Total 6 Click Score: 9   End of Session Equipment Utilized During Treatment:  (wheelchair) Nurse Communication: Mobility status  Activity Tolerance: Patient tolerated treatment well Patient left: in bed;with call bell/phone within reach;with bed alarm set  OT Visit Diagnosis: Other abnormalities of gait and mobility (R26.89)                Time: 9197-9185 OT Time Calculation (min): 12 min Charges:  OT General Charges $OT Visit: 1 Visit OT Evaluation $OT Eval Low Complexity: 1 Low Ranard Harte, OTR/L 09/28/24, 9:40 AM  Duwaine FORBES Saupe 09/28/2024, 9:38  AM

## 2024-09-28 NOTE — Evaluation (Signed)
 Physical Therapy Evaluation and Discharge Patient Details Name: Sharon Stevenson MRN: [id number] DOB: 11-27-1961 Today's Date: 09/28/2024  History of Present Illness  Patient is a 63 year old female with generalized weakness, sepsis due to UTI. S/p left ureteral stent placement. PMH: bedbound, minimally verbal, seizure disorder, CVA  Clinical Impression  Patient is cooperative throughout session. She is able to follow single step commands with increased time. Per notes, patient lives at home with her son and is mostly bed bound. Patient has baseline mobility and communication impairments and requires assistance at home.  Today the patient was able to complete a transfer to and from her own wheelchair with Min A for safety. She needs intermittent assistance with bed mobility. The patient does not appear to be in pain with activity. The patient appears to be at or near her baseline level of functional independence. No acute PT needs anticipated at this time. Will sign off.       If plan is discharge home, recommend the following: Supervision due to cognitive status;Help with stairs or ramp for entrance;Assist for transportation;Assistance with cooking/housework;Assistance with feeding;Direct supervision/assist for medications management;Direct supervision/assist for financial management;A little help with walking and/or transfers;A little help with bathing/dressing/bathroom   Can travel by private vehicle        Equipment Recommendations None recommended by PT  Recommendations for Other Services       Functional Status Assessment Patient has not had a recent decline in their functional status     Precautions / Restrictions Precautions Precautions: Fall Recall of Precautions/Restrictions: Impaired Restrictions Weight Bearing Restrictions Per Provider Order: No      Mobility  Bed Mobility Overal bed mobility: Needs Assistance Bed Mobility: Supine to Sit, Sit to Supine, Rolling Rolling:  Modified independent (Device/Increase time)   Supine to sit: Min assist Sit to supine: Supervision   General bed mobility comments: patient pulled on therapist hand to assist with trunk for sitting upright. othrewise, no physical assistance needed. HOB flat and no use of bed rails    Transfers Overall transfer level: Needs assistance Equipment used: 1 person hand held assist Transfers: Sit to/from Stand, Bed to chair/wheelchair/BSC Sit to Stand: Min assist Stand pivot transfers: Min assist         General transfer comment: steadying assistance provided. cues for initiation and sequencing    Ambulation/Gait               General Gait Details: ambulation not attempted  Stairs            Wheelchair Mobility     Tilt Bed    Modified Rankin (Stroke Patients Only)       Balance Overall balance assessment: Needs assistance   Sitting balance-Leahy Scale: Fair Sitting balance - Comments: CGA for safety. patient leans to the right once with what appears to be an attempt to lay down and not a true loss of balance   Standing balance support: Single extremity supported Standing balance-Leahy Scale: Poor Standing balance comment: Min A for safety, hand held assistance                             Pertinent Vitals/Pain Pain Assessment Pain Assessment: No/denies pain    Home Living Family/patient expects to be discharged to:: Private residence Living Arrangements: Children;Other relatives (son) Available Help at Discharge: Family             Home Equipment: Wheelchair - manual  Prior Function Prior Level of Function : Needs assist  Cognitive Assist : Mobility (cognitive);ADLs (cognitive)     Physical Assist : Mobility (physical);ADLs (physical) Mobility (physical): Transfers;Bed mobility ADLs (physical): Feeding;Grooming;Dressing;Toileting;Bathing;IADLs Mobility Comments: per chart review, pt utilizes a W/C for mobility at home, pt  agrees to this reports she has not walked in a long time ADLs Comments: per chart son assists with all ADLs with pt agreeing to this during eval     Extremity/Trunk Assessment   Upper Extremity Assessment Upper Extremity Assessment: Overall WFL for tasks assessed    Lower Extremity Assessment Lower Extremity Assessment: Generalized weakness       Communication   Communication Communication: Impaired Factors Affecting Communication: Difficulty expressing self (baseline impairments)    Cognition Arousal: Alert Behavior During Therapy: Impulsive   PT - Cognitive impairments: History of cognitive impairments, No family/caregiver present to determine baseline                         Following commands: Impaired Following commands impaired: Follows one step commands with increased time     Cueing Cueing Techniques: Verbal cues, Tactile cues, Gestural cues     General Comments General comments (skin integrity, edema, etc.): patient's brief was saturated with urine and replaced    Exercises     Assessment/Plan    PT Assessment Patient does not need any further PT services  PT Problem List         PT Treatment Interventions      PT Goals (Current goals can be found in the Care Plan section)  Acute Rehab PT Goals PT Goal Formulation: All assessment and education complete, DC therapy    Frequency       Co-evaluation               AM-PAC PT 6 Clicks Mobility  Outcome Measure Help needed turning from your back to your side while in a flat bed without using bedrails?: None Help needed moving from lying on your back to sitting on the side of a flat bed without using bedrails?: A Little Help needed moving to and from a bed to a chair (including a wheelchair)?: A Little Help needed standing up from a chair using your arms (e.g., wheelchair or bedside chair)?: A Little Help needed to walk in hospital room?: A Lot Help needed climbing 3-5 steps with a  railing? : A Lot 6 Click Score: 17    End of Session   Activity Tolerance: Patient tolerated treatment well Patient left: in bed;with call bell/phone within reach;with bed alarm set;with nursing/sitter in room Nurse Communication: Mobility status      Time: 9199-9184 PT Time Calculation (min) (ACUTE ONLY): 15 min   Charges:   PT Evaluation $PT Eval Low Complexity: 1 Low   PT General Charges $$ ACUTE PT VISIT: 1 Visit         Sharon Stevenson, PT, MPT   Sharon Stevenson 09/28/2024, 9:52 AM

## 2024-09-28 NOTE — Discharge Summary (Signed)
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 SENSITIVEThis is a modified FDA-approved test that has been validated and its performance characteristics determined by the reporting laboratory.  This laboratory is certified under the Clinical Laboratory Improvement Amendments CLIA as qualified to perform high complexity clinical laboratory testing.    ERTAPENEM Value in next row Sensitive      4 SENSITIVEThis is a modified FDA-approved test that has been validated and its performance characteristics determined by the reporting laboratory.  This laboratory is certified under the Clinical Laboratory Improvement Amendments CLIA as qualified to perform high  complexity clinical laboratory testing.    CEFTRIAXONE  Value in next row Sensitive      4 SENSITIVEThis is a modified FDA-approved test that has been validated and its performance characteristics determined by the reporting laboratory.  This laboratory is certified under the Clinical Laboratory Improvement Amendments CLIA as qualified to perform high complexity clinical laboratory testing.    CIPROFLOXACIN Value in next row Sensitive      4 SENSITIVEThis is a modified FDA-approved test that has been validated and its performance characteristics determined by the reporting laboratory.  This laboratory is certified  under the Clinical Laboratory Improvement Amendments CLIA as qualified to perform high complexity clinical laboratory testing.    GENTAMICIN Value in next row Sensitive      4 SENSITIVEThis is a modified FDA-approved test that has been validated and its performance characteristics determined by the reporting laboratory.  This laboratory is certified under the Clinical Laboratory Improvement Amendments CLIA as qualified to perform high complexity clinical laboratory testing.    NITROFURANTOIN Value in next row Resistant      4 SENSITIVEThis is a modified FDA-approved test that has been validated and its performance characteristics determined by the reporting laboratory.  This laboratory is certified under the Clinical Laboratory Improvement Amendments CLIA as qualified to perform high complexity clinical laboratory testing.    TRIMETH/SULFA Value in next row Sensitive      4 SENSITIVEThis is a modified FDA-approved test that has been validated and its performance characteristics determined by the reporting laboratory.  This laboratory is certified under the Clinical Laboratory Improvement Amendments CLIA as qualified to perform high complexity clinical laboratory testing.    AMPICILLIN/SULBACTAM Value in next row Sensitive      4 SENSITIVEThis is a modified FDA-approved test that has been  validated and its performance characteristics determined by the reporting laboratory.  This laboratory is certified under the Clinical Laboratory Improvement Amendments CLIA as qualified to perform high complexity clinical laboratory testing.    PIP/TAZO Value in next row Sensitive      <=4 SENSITIVEThis is a modified FDA-approved test that has been validated and its performance characteristics determined by the reporting laboratory.  This laboratory is certified under the Clinical Laboratory Improvement Amendments CLIA as qualified to perform high complexity clinical laboratory testing.    MEROPENEM Value in next row Sensitive      <=4 SENSITIVEThis is a modified FDA-approved test that has been validated and its performance characteristics determined by the reporting laboratory.  This laboratory is certified under the Clinical Laboratory Improvement Amendments CLIA as qualified to perform high complexity clinical laboratory testing.    * 30,000 COLONIES/mL PROTEUS MIRABILIS    Labs: CBC: Recent Labs  Lab 09/24/24 1930 09/25/24 0612 09/27/24 0659 09/28/24 0646  WBC 13.9* 14.7* 12.5* 11.0*  NEUTROABS  --   --  9.5* 7.7  HGB 11.8* 9.8* 9.6* 9.8*  HCT 34.9* 28.3* 27.9* 29.7*  MCV 86.2 84.0 86.4 88.1  PLT 119* 109* 278 356   Basic Metabolic Panel: Recent Labs  Lab 09/24/24 1930 09/24/24 2110 09/25/24 0612 09/27/24 0659 09/28/24 0646  NA 128*  --  127* 139 142  K 3.7  --  3.9 3.8 4.2  CL 95*  --  97* 108 106  CO2 18*  --  18* 21* 20*  GLUCOSE 88  --  134* 79 67*  BUN 85*  --  76* 77* 68*  CREATININE 5.01*  --  4.30* 4.55* 4.23*  CALCIUM 7.7*  --  7.4* 7.7* 8.2*  MG  --  1.6*  --   --   --    Liver Function Tests: Recent Labs  Lab 09/24/24 1930  AST 24  ALT 18  ALKPHOS 138*  BILITOT 1.9*  PROT 6.6  ALBUMIN 2.3*   CBG: Recent Labs  Lab 09/24/24 1935 09/24/24 2059  GLUCAP 58* 127*    Discharge time spent:  35 minutes.  Signed: Drue ONEIDA Potter, MD [facility] 09/28/2024

## 2024-09-29 LAB — CULTURE, BLOOD (ROUTINE X 2)

## 2024-10-01 ENCOUNTER — Other Ambulatory Visit: Payer: Self-pay

## 2024-10-01 DIAGNOSIS — N201 Calculus of ureter: Secondary | ICD-10-CM

## 2024-10-01 NOTE — Progress Notes (Unsigned)
 Surgical Physician Order Form [facility]  Dr. Penne Skye, MD  * Scheduling expectation : 2 to 3 weeks   *Length of Case:   *Clearance needed: Yes- PCP   *Anticoagulation Instructions:  will need to hold Plavix   *Aspirin Instructions:  N/A  *Post-op visit Date/Instructions:   TBD  *Diagnosis: Left Ureteral Stone  *Procedure: left  Ureteroscopy w/laser lithotripsy & stent exchange (47643)   Additional orders: N/A  -Admit type: OUTpatient  -Anesthesia: General  -VTE Prophylaxis Standing Order SCD's       Other:   -Standing Lab Orders Per Anesthesia    Lab other: UA&Urine Culture  -Standing Test orders EKG/Chest x-ray per Anesthesia       Test other:   - Medications:  Ancef  2gm IV  -Other orders:  N/A

## 2024-10-02 ENCOUNTER — Telehealth: Payer: Self-pay

## 2024-10-02 NOTE — Telephone Encounter (Signed)
 Per Dr. Georganne, Patient is to be scheduled for Left Ureteroscopy with Laser Lithotripsy and Stent Placement   Sharon Stevenson was contacted and possible surgical dates were discussed, Tuesday November 25th, 2025 was agreed upon for surgery.   Patient was instructed that Dr. Georganne will require them to provide a pre-op UA & CX prior to surgery. This was ordered and scheduled drop off appointment was made for 10/18/2024.    Patient was directed to call [phone] between 1-3pm the day before surgery to find out surgical arrival time.  Instructions were given not to eat or drink from midnight on the night before surgery and have a driver for the day of surgery. On the surgery day patient was instructed to enter through the [facility] entrance of [facility] report the Same Day Surgery desk.   Pre-Admit Testing will be in contact via phone to set up an interview with the anesthesia team to review your history and medications prior to surgery.   Reminder of this information was sent via Mail to the patient.

## 2024-10-02 NOTE — Progress Notes (Signed)
   [facility] Surgical Posting Form  Surgery Date: Date: 10/30/2024  Surgeon: Dr. Penne Skye, MD  Inpt ( No  )   Outpt (Yes)   Obs ( No  )   Diagnosis: N20.1 Left Ureteral Stone  -CPT: [phone]  Surgery: Left Ureteroscopy with Laser Lithotripsy and Stent Placement  Stop Anticoagulations: Yes, will need to hold Plavix  and follow up with PCP  Cardiac/Medical/Pulmonary Clearance needed: Yes  Clearance needed from Dr: Silvio Ramp, NP  Clearance request sent on: Date: 10/02/24  *Orders entered into EPIC  Date: 10/02/24   *Case booked in EPIC  Date: 10/02/24  *Notified pt of Surgery: Date: 10/02/24  PRE-OP UA & CX: yes, will obtain in clinic on 10/18/2024  *Placed into Prior Authorization Work [place] Date: 10/02/24  Assistant/laser/rep:No

## 2024-10-02 NOTE — Progress Notes (Signed)
  Phone Number: [phone] for Surgical Coordinator Fax Number: [phone]  REQUEST FOR SURGICAL CLEARANCE      Date: 10/02/2024  Faxed to: Silvio Ramp, NP  Surgeon: Dr. Penne Skye, MD     Date of Surgery: 10/30/2024  Operation: Left Ureteroscopy with Laser Lithotripsy and Stent Placement   Anesthesia Type: General   Diagnosis: Left Ureteral Stone  Patient Requires:   Medical Clearance : Yes  Reason: Patient will need to hold Plavix  for 5 days prior to surgery.    Risk Assessment:    Low   []       Moderate   []     High   []           This patient is optimized for surgery  YES []       NO   []    I recommend further assessment/workup prior to surgery. YES []      NO  []   Appointment scheduled for: _______________________   Further recommendations: ____________________________________     Physician Signature:__________________________________   Printed Name: ________________________________________   Date: _________________

## 2024-10-16 NOTE — Progress Notes (Signed)
 10/18/2024 8:31 PM   Sharon Stevenson 08-29-1961 [id number]  Referring provider: Alston Silvio [name], FNP [address] [place],  [place] [address]  Urological history: 1. Nephrolithiasis - 7 mm left ureteral stone, s/p left stent placement (09/2024)   Chief Complaint  Patient presents with   Pre-op Exam   HPI: Sharon Stevenson is a 63 y.o. woman who presents today for CATH UA and UCX in preparation for upcoming ureteroscopy with her son, Sharon Stevenson.    Previous records reviewed.  She has no complaints today.  She is having no issues with the stent.  Patient denies any modifying or aggravating factors.  Patient denies any recent UTI's, gross hematuria, dysuria or suprapubic/flank pain.  Patient denies any fevers, chills, nausea or vomiting.    CATH UA yellow slightly cloudy, specific gravity 1.020, pH 6.0, 3+ heme, 2+ leukocytes, > 30 WBC's, 11-30 RBC's, 0-10 epithelial cells, and many bacteria.    Serum creatinine (09/2024) 4.23, eGFR 11  PMH: Past Medical History:  Diagnosis Date   Menorrhagia with regular cycle 2014   Ablation endometrial, 01/02/2013   Seizures (HCC)    seizures happened with her stroke, none since   Stroke [name]) 2008    Surgical History: Past Surgical History:  Procedure Laterality Date   CESAREAN SECTION     COLONOSCOPY WITH PROPOFOL  N/A 09/21/2023   Procedure: COLONOSCOPY WITH PROPOFOL ;  Surgeon: Therisa Bi, MD;  Location: [facility] ENDOSCOPY;  Service: Gastroenterology;  Laterality: N/A;   CYSTOSCOPY/RETROGRADE/URETEROSCOPY/STONE EXTRACTION WITH BASKET Left 09/24/2024   Procedure: CYSTOSCOPY, RIGID, WITH STENT REPLACEMENT;  Surgeon: Georganne Penne SAUNDERS, MD;  Location: [facility] ORS;  Service: Urology;  Laterality: Left;   DILITATION & CURRETTAGE/HYSTROSCOPY WITH THERMACHOICE ABLATION  01/02/2013   Procedure: DILATATION & CURETTAGE/HYSTEROSCOPY WITH THERMACHOICE ABLATION;  Surgeon: Norleen LULLA Server, MD;  Location: [facility] ORS;  Service: Gynecology;  Laterality: N/A;  Total Ablation  Time = 12 minutes 36 seconds     ; 86-88 deg C   HEMOSTASIS CLIP PLACEMENT  09/21/2023   Procedure: HEMOSTASIS CLIP PLACEMENT;  Surgeon: Therisa Bi, MD;  Location: [facility] ENDOSCOPY;  Service: Gastroenterology;;   POLYPECTOMY  09/21/2023   Procedure: POLYPECTOMY;  Surgeon: Therisa Bi, MD;  Location: [facility] ENDOSCOPY;  Service: Gastroenterology;;   TUBAL LIGATION      Home Medications:  Allergies as of 10/18/2024       Reactions   Codeine Nausea And Vomiting        Medication List        Accurate as of October 18, 2024 11:59 PM. If you have any questions, ask your nurse or doctor.          cefpodoxime 100 MG tablet Commonly known as: VANTIN Take 1 tablet (100 mg total) by mouth daily. Started by: CLOTILDA CORNWALL   DULoxetine  60 MG capsule Commonly known as: CYMBALTA  Take 60 mg by mouth at bedtime.   hydrOXYzine  50 MG capsule Commonly known as: Vistaril  Take 1 capsule (50 mg total) by mouth 3 (three) times daily as needed. What changed: when to take this   levETIRAcetam  500 MG tablet Commonly known as: KEPPRA  Take 500 mg by mouth 2 (two) times daily.   linaclotide  290 MCG Caps capsule Commonly known as: Linzess  Take 1 capsule (290 mcg total) by mouth daily before breakfast. What changed:  when to take this reasons to take this   LORazepam  0.5 MG tablet Commonly known as: ATIVAN  Take 0.5 mg by mouth 2 (two) times daily.  Nuedexta  20-10 MG capsule Generic drug: Dextromethorphan -quiNIDine Take 1 capsule by mouth at bedtime.   pantoprazole  40 MG tablet Commonly known as: PROTONIX  Take 40 mg by mouth daily.   phenytoin  50 MG tablet Commonly known as: DILANTIN  Chew 150 mg by mouth 2 (two) times daily.   QUEtiapine  400 MG tablet Commonly known as: SEROQUEL  Take 400 mg by mouth at bedtime.   senna-docusate 8.6-50 MG tablet Commonly known as: Senokot-S Take 1 tablet by mouth at bedtime.   simvastatin  20 MG tablet Commonly known as: ZOCOR  Take 20 mg by  mouth every morning.        Allergies:  Allergies  Allergen Reactions   Codeine Nausea And Vomiting    Family History: No family history on file.  Social History:  reports that she has quit smoking. She has quit using smokeless tobacco.  Her smokeless tobacco use included chew. She reports that she does not drink alcohol and does not use drugs.  ROS: Pertinent ROS in HPI  Physical Exam: BP 104/76   Pulse 96   Wt 188 lb 0.8 oz (85.3 kg)   BMI 29.45 kg/m   Constitutional:  Well nourished. Alert and oriented, No acute distress. HEENT: [place] AT, moist mucus membranes.  Trachea midline Cardiovascular: No clubbing, cyanosis, or edema. Respiratory: Normal respiratory effort, no increased work of breathing. GU: No CVA tenderness.  No bladder fullness or masses.  Recession of labia minora, dry, pale vulvar vaginal mucosa and loss of mucosal ridges and folds.  Normal urethral meatus, no lesions, no prolapse, no discharge.   No urethral masses, tenderness and/or tenderness.  Anus and perineum are without rashes or lesions.    Neurologic: Grossly intact, no focal deficits, moving all 4 extremities. Psychiatric: Normal mood and affect.    Laboratory Data: See Epic and HPI   I have reviewed the labs.   Pertinent Imaging: N/A  In and Out Catheterization Patient is present today for a I & O catheterization. Patient was cleaned and prepped in a sterile fashion with betadine . A 14 FR cath was inserted no complications were noted , 70 ml of urine return was noted, urine was yellow clear in color. A clean urine sample was collected for urinalysis and culture. Bladder was drained  And catheter was removed with out difficulty.    Performed by: CLOTILDA CORNWALL, PA-C and Laymon Ned, CMA     Assessment & Plan:    1. Left ureteral stone -Patient and family are counseled on operative technique and associated risks. Informed of planned ureteral stent placement, expected to remain in situ  for 3-10 days, with potential for stent-related symptoms including flank pain, bladder discomfort, dysuria, urgency, frequency, urinary incontinence, and gross hematuria. -Stent removal options reviewed--either via office cystoscopy or self-removal via extraction string, to be determined intraoperatively. -Residual nephrolithiasis or ureterolithiasis may persist post-procedure, potentially requiring future intervention. -Intraoperative risks include ureteral injury, which may necessitate open surgical repair. Additional risks include hemorrhage, infection, and complications related to general anesthesia (e.g., MI, CVA, paralysis, coma, death). -Patient advised to contact clinic or present to ED for fever, uncontrolled pain, or intractable emesis to facilitate urgent evaluation and management. - UA appears infected  - Urine sent for culture - started on Vantin 100 mg daily because of renal function  Return for Left URS/LL/ureteral stent placement/exchange.  These notes generated with voice recognition software. I apologize for typographical errors.  CLOTILDA CORNWALL RIGGERS  [facility] [address]  [address]  [place], [place] [address] [phone]

## 2024-10-16 NOTE — H&P (View-Only) (Signed)
 10/18/2024 8:31 PM   Sharon Stevenson 08-29-1961 [id number]  Referring provider: Alston Silvio [name], FNP [address] [place],  [place] [address]  Urological history: 1. Nephrolithiasis - 7 mm left ureteral stone, s/p left stent placement (09/2024)   Chief Complaint  Patient presents with   Pre-op Exam   HPI: Sharon Stevenson is a 63 y.o. woman who presents today for CATH UA and UCX in preparation for upcoming ureteroscopy with her son, Sharon Stevenson.    Previous records reviewed.  She has no complaints today.  She is having no issues with the stent.  Patient denies any modifying or aggravating factors.  Patient denies any recent UTI's, gross hematuria, dysuria or suprapubic/flank pain.  Patient denies any fevers, chills, nausea or vomiting.    CATH UA yellow slightly cloudy, specific gravity 1.020, pH 6.0, 3+ heme, 2+ leukocytes, > 30 WBC's, 11-30 RBC's, 0-10 epithelial cells, and many bacteria.    Serum creatinine (09/2024) 4.23, eGFR 11  PMH: Past Medical History:  Diagnosis Date   Menorrhagia with regular cycle 2014   Ablation endometrial, 01/02/2013   Seizures (HCC)    seizures happened with her stroke, none since   Stroke Sharon Stevenson) 2008    Surgical History: Past Surgical History:  Procedure Laterality Date   CESAREAN SECTION     COLONOSCOPY WITH PROPOFOL  N/A 09/21/2023   Procedure: COLONOSCOPY WITH PROPOFOL ;  Surgeon: Sharon Bi, MD;  Location: [facility] ENDOSCOPY;  Service: Gastroenterology;  Laterality: N/A;   CYSTOSCOPY/RETROGRADE/URETEROSCOPY/STONE EXTRACTION WITH BASKET Left 09/24/2024   Procedure: CYSTOSCOPY, RIGID, WITH STENT REPLACEMENT;  Surgeon: Sharon Penne SAUNDERS, MD;  Location: [facility] ORS;  Service: Urology;  Laterality: Left;   DILITATION & CURRETTAGE/HYSTROSCOPY WITH THERMACHOICE ABLATION  01/02/2013   Procedure: DILATATION & CURETTAGE/HYSTEROSCOPY WITH THERMACHOICE ABLATION;  Surgeon: Sharon LULLA Server, MD;  Location: [facility] ORS;  Service: Gynecology;  Laterality: N/A;  Total Ablation  Time = 12 minutes 36 seconds     ; 86-88 deg C   HEMOSTASIS CLIP PLACEMENT  09/21/2023   Procedure: HEMOSTASIS CLIP PLACEMENT;  Surgeon: Sharon Bi, MD;  Location: [facility] ENDOSCOPY;  Service: Gastroenterology;;   POLYPECTOMY  09/21/2023   Procedure: POLYPECTOMY;  Surgeon: Sharon Bi, MD;  Location: [facility] ENDOSCOPY;  Service: Gastroenterology;;   TUBAL LIGATION      Home Medications:  Allergies as of 10/18/2024       Reactions   Codeine Nausea And Vomiting        Medication List        Accurate as of October 18, 2024 11:59 PM. If you have any questions, ask your nurse or doctor.          cefpodoxime 100 MG tablet Commonly known as: VANTIN Take 1 tablet (100 mg total) by mouth daily. Started by: Sharon Stevenson   DULoxetine  60 MG capsule Commonly known as: CYMBALTA  Take 60 mg by mouth at bedtime.   hydrOXYzine  50 MG capsule Commonly known as: Vistaril  Take 1 capsule (50 mg total) by mouth 3 (three) times daily as needed. What changed: when to take this   levETIRAcetam  500 MG tablet Commonly known as: KEPPRA  Take 500 mg by mouth 2 (two) times daily.   linaclotide  290 MCG Caps capsule Commonly known as: Linzess  Take 1 capsule (290 mcg total) by mouth daily before breakfast. What changed:  when to take this reasons to take this   LORazepam  0.5 MG tablet Commonly known as: ATIVAN  Take 0.5 mg by mouth 2 (two) times daily.  Nuedexta  20-10 MG capsule Generic drug: Dextromethorphan -quiNIDine Take 1 capsule by mouth at bedtime.   pantoprazole  40 MG tablet Commonly known as: PROTONIX  Take 40 mg by mouth daily.   phenytoin  50 MG tablet Commonly known as: DILANTIN  Chew 150 mg by mouth 2 (two) times daily.   QUEtiapine  400 MG tablet Commonly known as: SEROQUEL  Take 400 mg by mouth at bedtime.   senna-docusate 8.6-50 MG tablet Commonly known as: Senokot-S Take 1 tablet by mouth at bedtime.   simvastatin  20 MG tablet Commonly known as: ZOCOR  Take 20 mg by  mouth every morning.        Allergies:  Allergies  Allergen Reactions   Codeine Nausea And Vomiting    Family History: No family history on file.  Social History:  reports that she has quit smoking. She has quit using smokeless tobacco.  Her smokeless tobacco use included chew. She reports that she does not drink alcohol and does not use drugs.  ROS: Pertinent ROS in HPI  Physical Exam: BP 104/76   Pulse 96   Wt 188 lb 0.8 oz (85.3 kg)   BMI 29.45 kg/m   Constitutional:  Well nourished. Alert and oriented, No acute distress. HEENT: [place] AT, moist mucus membranes.  Trachea midline Cardiovascular: No clubbing, cyanosis, or edema. Respiratory: Normal respiratory effort, no increased work of breathing. GU: No CVA tenderness.  No bladder fullness or masses.  Recession of labia minora, dry, pale vulvar vaginal mucosa and loss of mucosal ridges and folds.  Normal urethral meatus, no lesions, no prolapse, no discharge.   No urethral masses, tenderness and/or tenderness.  Anus and perineum are without rashes or lesions.    Neurologic: Grossly intact, no focal deficits, moving all 4 extremities. Psychiatric: Normal mood and affect.    Laboratory Data: See Epic and HPI   I have reviewed the labs.   Pertinent Imaging: N/A  In and Out Catheterization Patient is present today for a I & O catheterization. Patient was cleaned and prepped in a sterile fashion with betadine . A 14 FR cath was inserted no complications were noted , 70 ml of urine return was noted, urine was yellow clear in color. A clean urine sample was collected for urinalysis and culture. Bladder was drained  And catheter was removed with out difficulty.    Performed by: Sharon CORNWALL, PA-C and Sharon Stevenson, CMA     Assessment & Plan:    1. Left ureteral stone -Patient and family are counseled on operative technique and associated risks. Informed of planned ureteral stent placement, expected to remain in situ  for 3-10 days, with potential for stent-related symptoms including flank pain, bladder discomfort, dysuria, urgency, frequency, urinary incontinence, and gross hematuria. -Stent removal options reviewed--either via office cystoscopy or self-removal via extraction string, to be determined intraoperatively. -Residual nephrolithiasis or ureterolithiasis may persist post-procedure, potentially requiring future intervention. -Intraoperative risks include ureteral injury, which may necessitate open surgical repair. Additional risks include hemorrhage, infection, and complications related to general anesthesia (e.g., MI, CVA, paralysis, coma, death). -Patient advised to contact clinic or present to ED for fever, uncontrolled pain, or intractable emesis to facilitate urgent evaluation and management. - UA appears infected  - Urine sent for culture - started on Vantin 100 mg daily because of renal function  Return for Left URS/LL/ureteral stent placement/exchange.  These notes generated with voice recognition software. I apologize for typographical errors.  Sharon Stevenson RIGGERS  [facility] [address]  [address]  [place], [place] [address] [phone]

## 2024-10-18 ENCOUNTER — Ambulatory Visit (INDEPENDENT_AMBULATORY_CARE_PROVIDER_SITE_OTHER): Admitting: Urology

## 2024-10-18 ENCOUNTER — Encounter: Payer: Self-pay | Admitting: Urology

## 2024-10-18 VITALS — BP 104/76 | HR 96 | Wt 188.1 lb

## 2024-10-18 DIAGNOSIS — N201 Calculus of ureter: Secondary | ICD-10-CM

## 2024-10-18 MED ORDER — CEFPODOXIME PROXETIL 100 MG PO TABS: 100.0000 mg | ORAL_TABLET | Freq: Every day | ORAL | 0 refills | Status: AC

## 2024-10-19 LAB — URINALYSIS, COMPLETE
Bilirubin, UA: NEGATIVE
Glucose, UA: NEGATIVE
Ketones, UA: NEGATIVE
Nitrite, UA: NEGATIVE
Protein,UA: NEGATIVE
Specific Gravity, UA: 1.02 (ref 1.005–1.030)
Urobilinogen, Ur: 0.2 mg/dL (ref 0.2–1.0)
pH, UA: 6 (ref 5.0–7.5)

## 2024-10-19 LAB — MICROSCOPIC EXAMINATION: WBC, UA: >30 /HPF — AB (ref 0–5)

## 2024-10-22 ENCOUNTER — Telehealth: Payer: Self-pay

## 2024-10-22 NOTE — Telephone Encounter (Signed)
 Received surgical clearance from PCP. Patient to hold Plavix  for 5 days prior to surgery. Christy- Pt. Caregiver and son aware.

## 2024-10-23 ENCOUNTER — Encounter
Admission: RE | Admit: 2024-10-23 | Discharge: 2024-10-23 | Disposition: A | Discharge status: Home / Self Care | Source: Ambulatory Visit | Attending: Urology | Admitting: Urology

## 2024-10-23 ENCOUNTER — Other Ambulatory Visit: Payer: Self-pay

## 2024-10-23 DIAGNOSIS — Z01818 Encounter for other preprocedural examination: Secondary | ICD-10-CM

## 2024-10-23 HISTORY — DX: Gastro-esophageal reflux disease without esophagitis: K21.9

## 2024-10-23 HISTORY — DX: Hyperlipidemia, unspecified: E78.5

## 2024-10-23 HISTORY — DX: Unspecified symptoms and signs involving cognitive functions following cerebral infarction: I69.319

## 2024-10-23 HISTORY — DX: Atherosclerosis of aorta: I70.0

## 2024-10-23 HISTORY — DX: Benign neoplasm of colon, unspecified: D12.6

## 2024-10-23 HISTORY — DX: Depression, unspecified: F32.A

## 2024-10-23 HISTORY — DX: Long term (current) use of antithrombotics/antiplatelets: Z79.02

## 2024-10-23 HISTORY — DX: Calculus of ureter: N20.1

## 2024-10-23 HISTORY — DX: Anxiety disorder, unspecified: F41.9

## 2024-10-23 HISTORY — DX: Peripheral vascular disease, unspecified: I73.9

## 2024-10-23 HISTORY — DX: Urinary tract infection, site not specified: N39.0

## 2024-10-23 HISTORY — DX: Unspecified hydronephrosis: N13.30

## 2024-10-23 HISTORY — DX: Other constipation: K59.09

## 2024-10-23 HISTORY — DX: Personal history of nicotine dependence: Z87.891

## 2024-10-23 NOTE — Progress Notes (Signed)
 Did anesthesia interview with pts son Tomica Arseneault who is pts POA. [name] will bring [facility] POA paperwork with him the day of surgery so he can sign pts consents. Pt is not set up with my chart so I emailed instructions to pts son [name] and verbally went over instructions via phone. Wayne verbalized understanding

## 2024-10-23 NOTE — Patient Instructions (Signed)
 Your procedure is scheduled on:10-30-24 Tuesday Report to the Registration Desk on the 1st floor of the [facility].Then proceed to the 2nd floor Surgery Desk To find out your arrival time, please call [phone] between 1PM - 3PM on:10-29-24 Monday If your arrival time is 6:00 am, do not arrive before that time as the [facility] entrance doors do not open until 6:00 am.  REMEMBER: Instructions that are not followed completely may result in serious medical risk, up to and including death; or upon the discretion of your surgeon and anesthesiologist your surgery may need to be rescheduled.  Do not eat food OR drink liquids after midnight the night before surgery.  No gum chewing or hard candies.  One week prior to surgery:Stop NOW (10-23-24) Stop Anti-inflammatories (NSAIDS) such as Advil, Aleve, Ibuprofen, Motrin, Naproxen, Naprosyn and Aspirin based products such as Excedrin, Goody's Powder, BC Powder. Stop ANY OVER THE COUNTER supplements until after surgery.  You may however, continue to take Tylenol  if needed for pain up until the day of surgery.  Stop clopidogrel  (PLAVIX ) 5 days prior to surgery-Last dose will be on 10-24-24 Wednesday  Continue taking all of your other prescription medications up until the day of surgery.  ON THE DAY OF SURGERY ONLY TAKE THESE MEDICATIONS WITH SIPS OF WATER: -Dextromethorphan -Quinidine (NUEDEXTA )  -DULoxetine  (CYMBALTA )  -levETIRAcetam  (KEPPRA )  -LORazepam  (ATIVAN )  -pantoprazole  (PROTONIX )  -phenytoin  (DILANTIN )   No Alcohol for 24 hours before or after surgery.  No Smoking including e-cigarettes for 24 hours before surgery.  No chewable tobacco products for at least 6 hours before surgery.  No nicotine patches on the day of surgery.  Do not use any recreational drugs for at least a week (preferably 2 weeks) before your surgery.  Please be advised that the combination of cocaine and anesthesia may have negative outcomes, up to and  including death. If you test positive for cocaine, your surgery will be cancelled.  On the morning of surgery brush your teeth with toothpaste and water, you may rinse your mouth with mouthwash if you wish. Do not swallow any toothpaste or mouthwash.  Do not wear jewelry, make-up, hairpins, clips or nail polish.  For welded (permanent) jewelry: bracelets, anklets, waist bands, etc.  Please have this removed prior to surgery.  If it is not removed, there is a chance that hospital personnel will need to cut it off on the day of surgery.  Do not wear lotions, powders, or perfumes.   Do not shave body hair from the neck down 48 hours before surgery.  Contact lenses, hearing aids and dentures may not be worn into surgery.  Do not bring valuables to the hospital. [facility] is not responsible for any missing/lost belongings or valuables.   Notify your doctor if there is any change in your medical condition (cold, fever, infection).  Wear comfortable clothing (specific to your surgery type) to the hospital.  After surgery, you can help prevent lung complications by doing breathing exercises.  Take deep breaths and cough every 1-2 hours. Your doctor may order a device called an Incentive Spirometer to help you take deep breaths. When coughing or sneezing, hold a pillow firmly against your incision with both hands. This is called "splinting." Doing this helps protect your incision. It also decreases belly discomfort.  If you are being admitted to the hospital overnight, leave your suitcase in the car. After surgery it may be brought to your room.  In case of increased patient census, it  may be necessary for you, the patient, to continue your postoperative care in the Same Day Surgery department.  If you are being discharged the day of surgery, you will not be allowed to drive home. You will need a responsible individual to drive you home and stay with you for 24 hours after surgery.   If you  are taking public transportation, you will need to have a responsible individual with you.  Please call the Pre-admissions Testing Dept. at [phone] if you have any questions about these instructions.  Surgery Visitation Policy:  Patients having surgery or a procedure may have two visitors.  Children under the age of 68 must have an adult with them who is not the patient.   Merchandiser, Retail to address health-related social needs:  [email or web address]

## 2024-10-24 ENCOUNTER — Ambulatory Visit: Payer: Self-pay | Admitting: Urology

## 2024-10-24 LAB — CULTURE, URINE COMPREHENSIVE

## 2024-10-25 ENCOUNTER — Other Ambulatory Visit: Payer: Self-pay | Admitting: Urology

## 2024-10-25 ENCOUNTER — Other Ambulatory Visit

## 2024-10-25 ENCOUNTER — Other Ambulatory Visit: Payer: Self-pay

## 2024-10-25 DIAGNOSIS — N201 Calculus of ureter: Secondary | ICD-10-CM

## 2024-10-26 ENCOUNTER — Ambulatory Visit: Payer: Self-pay | Admitting: Urology

## 2024-10-26 ENCOUNTER — Telehealth: Payer: Self-pay | Admitting: Urology

## 2024-10-26 DIAGNOSIS — N201 Calculus of ureter: Secondary | ICD-10-CM

## 2024-10-26 LAB — BASIC METABOLIC PANEL WITH GFR
BUN/Creatinine Ratio: 26 (ref 12–28)
BUN: 33 mg/dL — ABNORMAL HIGH (ref 8–27)
CO2: 21 mmol/L (ref 20–29)
Calcium: 9.8 mg/dL (ref 8.7–10.3)
Chloride: 102 mmol/L (ref 96–106)
Creatinine, Ser: 1.28 mg/dL — ABNORMAL HIGH (ref 0.57–1.00)
Glucose: 90 mg/dL (ref 70–99)
Potassium: 5.1 mmol/L (ref 3.5–5.2)
Sodium: 139 mmol/L (ref 134–144)
eGFR: 48 mL/min/1.73 — ABNORMAL LOW (ref >59)

## 2024-10-26 MED ORDER — NITROFURANTOIN MONOHYD MACRO 100 MG PO CAPS: 100.0000 mg | ORAL_CAPSULE | Freq: Two times a day (BID) | ORAL | 0 refills | Status: AC

## 2024-10-26 NOTE — Telephone Encounter (Signed)
 I have contacted the patient's pharmacy and they have confirmed that the nitrofurantoin   has been filled and her insurance will cover the medication.  I have also spoken to her son and she will start the Macrobid  today.

## 2024-10-30 ENCOUNTER — Ambulatory Visit

## 2024-10-30 ENCOUNTER — Ambulatory Visit
Admission: RE | Admit: 2024-10-30 | Discharge: 2024-10-30 | Disposition: A | Discharge status: Home / Self Care | Attending: Urology | Admitting: Urology

## 2024-10-30 ENCOUNTER — Encounter
Admission: RE | Disposition: A | Discharge status: Home / Self Care | Payer: Self-pay | Source: Home / Self Care | Attending: Urology

## 2024-10-30 ENCOUNTER — Other Ambulatory Visit: Payer: Self-pay

## 2024-10-30 ENCOUNTER — Ambulatory Visit: Payer: Self-pay | Admitting: Urgent Care

## 2024-10-30 ENCOUNTER — Encounter: Payer: Self-pay | Admitting: Urology

## 2024-10-30 DIAGNOSIS — N201 Calculus of ureter: Secondary | ICD-10-CM | POA: Insufficient documentation

## 2024-10-30 DIAGNOSIS — I69318 Other symptoms and signs involving cognitive functions following cerebral infarction: Secondary | ICD-10-CM | POA: Diagnosis not present

## 2024-10-30 DIAGNOSIS — Z87891 Personal history of nicotine dependence: Secondary | ICD-10-CM | POA: Insufficient documentation

## 2024-10-30 DIAGNOSIS — Z419 Encounter for procedure for purposes other than remedying health state, unspecified: Secondary | ICD-10-CM

## 2024-10-30 DIAGNOSIS — Z01818 Encounter for other preprocedural examination: Secondary | ICD-10-CM

## 2024-10-30 HISTORY — PX: CYSTOSCOPY/URETEROSCOPY/HOLMIUM LASER: SHX6545

## 2024-10-30 SURGERY — CYSTOURETEROSCOPY, USING HOLMIUM LASER
Anesthesia: General | Site: Ureter | Laterality: Left

## 2024-10-30 MED ORDER — CEFAZOLIN SODIUM-DEXTROSE 2-4 GM/100ML-% IV SOLN
2.0000 g | INTRAVENOUS | Status: AC
  Administered 2024-10-30: 2 g via INTRAVENOUS

## 2024-10-30 MED ORDER — STERILE WATER FOR IRRIGATION IR SOLN
Status: DC | PRN
  Administered 2024-10-30: 500 mL

## 2024-10-30 MED ORDER — OXYCODONE HCL 5 MG/5ML PO SOLN: 5.0000 mg | Freq: Once | ORAL | Status: DC | PRN

## 2024-10-30 MED ORDER — CHLORHEXIDINE GLUCONATE 0.12 % MT SOLN
OROMUCOSAL | Status: AC
  Filled 2024-10-30: qty 15

## 2024-10-30 MED ORDER — LIDOCAINE HCL (PF) 2 % IJ SOLN
INTRAMUSCULAR | Status: AC
Start: 2024-10-30 — End: 2024-10-30
  Filled 2024-10-30: qty 5

## 2024-10-30 MED ORDER — SODIUM CHLORIDE 0.9 % IR SOLN
Status: DC | PRN
Start: 2024-10-30 — End: 2024-10-30
  Administered 2024-10-30: 3000 mL

## 2024-10-30 MED ORDER — DEXMEDETOMIDINE HCL IN NACL 80 MCG/20ML IV SOLN
INTRAVENOUS | Status: AC
  Filled 2024-10-30: qty 20

## 2024-10-30 MED ORDER — ORAL CARE MOUTH RINSE
15.0000 mL | Freq: Once | OROMUCOSAL | Status: AC
  Administered 2024-10-30: 15 mL via OROMUCOSAL

## 2024-10-30 MED ORDER — IOHEXOL 180 MG/ML  SOLN
INTRAMUSCULAR | Status: DC | PRN
Start: 2024-10-30 — End: 2024-10-30
  Administered 2024-10-30: 10 mL

## 2024-10-30 MED ORDER — CEFAZOLIN SODIUM-DEXTROSE 2-4 GM/100ML-% IV SOLN
INTRAVENOUS | Status: AC
  Filled 2024-10-30: qty 100

## 2024-10-30 MED ORDER — DROPERIDOL 2.5 MG/ML IJ SOLN: 0.6250 mg | Freq: Once | INTRAMUSCULAR | Status: DC | PRN

## 2024-10-30 MED ORDER — PROPOFOL 10 MG/ML IV BOLUS
INTRAVENOUS | Status: DC | PRN
Start: 2024-10-30 — End: 2024-10-30
  Administered 2024-10-30: 100 ug/kg/min via INTRAVENOUS
  Administered 2024-10-30 (×2): 50 mg via INTRAVENOUS
  Administered 2024-10-30: 100 mg via INTRAVENOUS

## 2024-10-30 MED ORDER — PHENAZOPYRIDINE HCL 200 MG PO TABS: 200.0000 mg | ORAL_TABLET | Freq: Three times a day (TID) | ORAL | 0 refills | Status: AC | PRN

## 2024-10-30 MED ORDER — ACETAMINOPHEN 10 MG/ML IV SOLN
INTRAVENOUS | Status: AC
  Filled 2024-10-30: qty 100

## 2024-10-30 MED ORDER — OXYCODONE HCL 5 MG PO TABS: 5.0000 mg | ORAL_TABLET | Freq: Once | ORAL | Status: DC | PRN

## 2024-10-30 MED ORDER — ACETAMINOPHEN 10 MG/ML IV SOLN: 1000.0000 mg | Freq: Once | INTRAVENOUS | Status: DC | PRN

## 2024-10-30 MED ORDER — FENTANYL CITRATE (PF) 100 MCG/2ML IJ SOLN: 25.0000 ug | INTRAMUSCULAR | Status: DC | PRN

## 2024-10-30 MED ORDER — KETOROLAC TROMETHAMINE 30 MG/ML IJ SOLN
INTRAMUSCULAR | Status: DC | PRN
  Administered 2024-10-30: 15 mg via INTRAVENOUS

## 2024-10-30 MED ORDER — PROPOFOL 1000 MG/100ML IV EMUL
INTRAVENOUS | Status: AC
  Filled 2024-10-30: qty 100

## 2024-10-30 MED ORDER — ACETAMINOPHEN 10 MG/ML IV SOLN
INTRAVENOUS | Status: DC | PRN
  Administered 2024-10-30: 1000 mg via INTRAVENOUS

## 2024-10-30 MED ORDER — PHENYLEPHRINE 80 MCG/ML (10ML) SYRINGE FOR IV PUSH (FOR BLOOD PRESSURE SUPPORT)
PREFILLED_SYRINGE | INTRAVENOUS | Status: AC
  Filled 2024-10-30: qty 10

## 2024-10-30 MED ORDER — DEXAMETHASONE SOD PHOSPHATE PF 10 MG/ML IJ SOLN
INTRAMUSCULAR | Status: DC | PRN
  Administered 2024-10-30: 10 mg via INTRAVENOUS

## 2024-10-30 MED ORDER — LACTATED RINGERS IV SOLN: INTRAVENOUS | Status: DC

## 2024-10-30 MED ORDER — LIDOCAINE HCL (CARDIAC) PF 100 MG/5ML IV SOSY
PREFILLED_SYRINGE | INTRAVENOUS | Status: DC | PRN
  Administered 2024-10-30: 100 mg via INTRAVENOUS

## 2024-10-30 MED ORDER — TAMSULOSIN HCL 0.4 MG PO CAPS: 0.4000 mg | ORAL_CAPSULE | Freq: Every day | ORAL | 0 refills | Status: AC

## 2024-10-30 MED ORDER — PHENYLEPHRINE 80 MCG/ML (10ML) SYRINGE FOR IV PUSH (FOR BLOOD PRESSURE SUPPORT)
PREFILLED_SYRINGE | INTRAVENOUS | Status: DC | PRN
  Administered 2024-10-30 (×2): 160 ug via INTRAVENOUS

## 2024-10-30 SURGICAL SUPPLY — 24 items
ADHESIVE MASTISOL STRL (MISCELLANEOUS) IMPLANT
BAG DRAIN SIEMENS DORNER NS (MISCELLANEOUS) ×2 IMPLANT
BAG PRESSURE INF REUSE 3000 (BAG) ×2 IMPLANT
BASKET ZERO TIP 1.9FR (BASKET) IMPLANT
BRUSH SCRUB EZ 4% CHG (MISCELLANEOUS) ×2 IMPLANT
CATH URET FLEX-TIP 2 LUMEN 10F (CATHETERS) IMPLANT
CATH URETL OPEN 5X70 (CATHETERS) IMPLANT
DRAPE UTILITY 15X26 TOWEL STRL (DRAPES) ×2 IMPLANT
DRSG TEGADERM 2-3/8X2-3/4 SM (GAUZE/BANDAGES/DRESSINGS) IMPLANT
FIBER LASER MOSES 200 DFL (Laser) IMPLANT
GLOVE BIOGEL PI IND STRL 7.0 (GLOVE) ×2 IMPLANT
GOWN STRL REUS W/ TWL LRG LVL3 (GOWN DISPOSABLE) ×4 IMPLANT
GUIDEWIRE STR DUAL SENSOR (WIRE) ×2 IMPLANT
KIT TURNOVER CYSTO (KITS) ×2 IMPLANT
PACK CYSTO AR (MISCELLANEOUS) ×2 IMPLANT
SET CYSTO IRRIGATION (SET/KITS/TRAYS/PACK) ×2 IMPLANT
SHEATH NAVIGATOR HD 12/14X36 (SHEATH) IMPLANT
SOL .9 NS 3000ML IRR UROMATIC (IV SOLUTION) ×2 IMPLANT
SOLN STERILE WATER 500 ML (IV SOLUTION) ×2 IMPLANT
STENT URET 6FRX24 CONTOUR (STENTS) IMPLANT
STENT URET 6FRX26 CONTOUR (STENTS) IMPLANT
SURGILUBE 2OZ TUBE FLIPTOP (MISCELLANEOUS) ×2 IMPLANT
SYR 10ML LL (SYRINGE) ×2 IMPLANT
VALVE UROSEAL ADJ ENDO (VALVE) IMPLANT

## 2024-10-30 NOTE — Anesthesia Preprocedure Evaluation (Addendum)
 Anesthesia Evaluation  Patient identified by MRN, date of birth, ID band Patient awake  General Assessment Comment:Head tremor One word responses to questions Alert to being in a hospital, year Moves all extremities to command Left eye and facial droop  Reviewed: Allergy & Precautions, H&P , NPO status , Patient's Chart, lab work & pertinent test results  Airway Mallampati: II  TM Distance: >3 FB Neck ROM: full    Dental no notable dental hx. (+) Edentulous Lower, Edentulous Upper   Pulmonary former smoker   Pulmonary exam normal        Cardiovascular negative cardio ROS Normal cardiovascular exam     Neuro/Psych Seizures -,  PSYCHIATRIC DISORDERS Anxiety     CVA (2008, with Residual deficits - bedbound, minimally verbal, contracted), Residual Symptoms    GI/Hepatic negative GI ROS, Neg liver ROS,,,  Endo/Other  negative endocrine ROS    Renal/GU Renal InsufficiencyRenal disease     Musculoskeletal   Abdominal Normal abdominal exam  (+)   Peds  Hematology  (+) Blood dyscrasia, anemia   Anesthesia Other Findings 7mm left mid/distal ureteral stone   - s/p prior Left ureteral stent placement 09/25/24 for obstructive sepsis    Past Medical History: No date: Adenomatous polyp of colon No date: Anxiety No date: Aortic atherosclerosis No date: Chronic constipation No date: 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 No date: Depression No date: Dyslipidemia No date: Former smoker No date: GERD (gastroesophageal reflux disease) No date: Hydronephrosis of left kidney No date: Left ureteral stone No date: Long term current use of clopidogrel  2014: Menorrhagia with regular cycle     Comment:  Ablation endometrial, 01/02/2013 No date: PVD (peripheral vascular disease) No date: Seizures (HCC)     Comment:  seizures happened with her stroke, none since No date: Sepsis due to gram-negative UTI (HCC) 2008: Stroke  [facility])     Comment:  cognitive deficits  Past Surgical History: No date: CESAREAN SECTION 09/21/2023: COLONOSCOPY WITH PROPOFOL ; N/A     Comment:  Procedure: COLONOSCOPY WITH PROPOFOL ;  Surgeon: Therisa Bi, MD;  Location: [facility] ENDOSCOPY;  Service:               Gastroenterology;  Laterality: N/A; 09/24/2024: CYSTOSCOPY/RETROGRADE/URETEROSCOPY/STONE EXTRACTION WITH  BASKET; Left     Comment:  Procedure: CYSTOSCOPY, RIGID, WITH STENT REPLACEMENT;                Surgeon: Georganne Penne SAUNDERS, MD;  Location: [facility] ORS;                Service: Urology;  Laterality: Left; 01/02/2013: DILITATION & CURRETTAGE/HYSTROSCOPY WITH THERMACHOICE  ABLATION     Comment:  Procedure: DILATATION & CURETTAGE/HYSTEROSCOPY WITH               THERMACHOICE ABLATION;  Surgeon: Norleen LULLA Server, MD;                Location: [facility] ORS;  Service: Gynecology;  Laterality: N/A;              Total Ablation Time = 12 minutes 36 seconds     ; 86-88               deg C 09/21/2023: HEMOSTASIS CLIP PLACEMENT     Comment:  Procedure: HEMOSTASIS CLIP PLACEMENT;  Surgeon: Therisa Bi, MD;  Location:  [facility] ENDOSCOPY;  Service:               Gastroenterology;; 09/21/2023: POLYPECTOMY     Comment:  Procedure: POLYPECTOMY;  Surgeon: Therisa Bi, MD;                Location: [facility] ENDOSCOPY;  Service: Gastroenterology;; No date: TUBAL LIGATION     Reproductive/Obstetrics negative OB ROS                              Anesthesia Physical Anesthesia Plan  ASA: 3  Anesthesia Plan: General   Post-op Pain Management: Ofirmev  IV (intra-op)*   Induction: Intravenous  PONV Risk Score and Plan: 2 and Ondansetron , Dexamethasone  and Midazolam   Airway Management Planned: LMA and Oral ETT  Additional Equipment:   Intra-op Plan:   Post-operative Plan: Extubation in OR  Informed Consent: I have reviewed the patients History and Physical, chart, labs and discussed the procedure  including the risks, benefits and alternatives for the proposed anesthesia with the patient or authorized representative who has indicated his/her understanding and acceptance.     Dental Advisory Given and Consent reviewed with POA  Plan Discussed with: CRNA and Surgeon  Anesthesia Plan Comments: (Son here for consent)         Anesthesia Quick Evaluation

## 2024-10-30 NOTE — Discharge Instructions (Addendum)
 DISCHARGE INSTRUCTIONS FOR KIDNEY STONE/URETERAL STENT   MEDICATIONS:  1.  Resume all your other meds from home - except do not take any extra narcotic pain meds that you may have at home.  2. Pyridium is to help with the burning/stinging when you urinate.  ACTIVITY:  1. No strenuous activity x 1week  2. No driving while on narcotic pain medications  3. Drink plenty of water  4. Continue to walk at home - you can still get blood clots when you are at home, so keep active, but don't over do it.  5. May return to work/school tomorrow or when you feel ready   BATHING:  1. You can shower and we recommend daily showers  2. You have a string coming from your urethra: The stent string is attached to your ureteral stent. Do not pull on this.   SIGNS/SYMPTOMS TO CALL:  Please call us  if you have a fever greater than 101.5, uncontrolled nausea/vomiting, uncontrolled pain, dizziness, unable to urinate, bloody urine, chest pain, shortness of breath, leg swelling, leg pain, redness around wound, drainage from wound, or any other concerns or questions.   You can reach us  at [phone].   FOLLOW-UP:  1. You will have an appointment in 6 weeks with a ultrasound of your kidneys prior.

## 2024-10-30 NOTE — Transfer of Care (Signed)
 Immediate Anesthesia Transfer of Care Note  Patient: Sharon Stevenson  Procedure(s) Performed: CYSTOURETEROSCOPY, USING HOLMIUM LASER (Left: Ureter)  Patient Location: PACU  Anesthesia Type:General  Level of Consciousness: drowsy  Airway & Oxygen Therapy: Patient Spontanous Breathing  Post-op Assessment: Report given to RN and Post -op Vital signs reviewed and stable  Post vital signs: Reviewed and stable  Last Vitals:  Vitals Value Taken Time  BP 113/60 10/30/24 09:36  Temp    Pulse 55 10/30/24 09:38  Resp    SpO2 99 % 10/30/24 09:38  Vitals shown include unfiled device data.  Last Pain:  Vitals:   10/30/24 0747  TempSrc: Tympanic         Complications: There were no known notable events for this encounter.

## 2024-10-30 NOTE — Interval H&P Note (Signed)
 History and Physical Interval Note:  10/30/2024 8:52 AM  Sharon Stevenson  has presented today for surgery, with the diagnosis of Left Ureteral Stone.  The various methods of treatment have been discussed with the patient and family. After consideration of risks, benefits and other options for treatment, the patient has consented to  Procedure(s): CYSTOSCOPY/URETEROSCOPY/HOLMIUM LASER/STENT PLACEMENT (Left) as a surgical intervention.  The patient's history has been reviewed, patient examined, no change in status, stable for surgery.  I have reviewed the patient's chart and labs.  Questions were answered to the patient's satisfaction.    General: no acute distress, alert/oriented, conversational  HEENT: equal nondilated pupils CV: regular rate Lung: unlabored breathing, regular rate and rhythm  Abd: nondistended, nontender with palpation, no palpable masses  MSK: moving all extremities without issue, normal observed motor function  Laterality: Left Procedure: Left URS/LL, stent removal vs exchange  Informed consent obtained, we specifically discussed the risks of bleeding, infection, post-operative pain, need for additional procedures. Operative site appropriately marked where indicated  Penne JONELLE Skye, MD 10/30/2024   Penne JONELLE Skye

## 2024-10-30 NOTE — Anesthesia Procedure Notes (Signed)
 Procedure Name: LMA Insertion Date/Time: 10/30/2024 8:59 AM  Performed by: Bonnetta Jimmey SAUNDERS, CRNAPre-anesthesia Checklist: Patient identified, Emergency Drugs available, Suction available, Patient being monitored and Timeout performed Patient Re-evaluated:Patient Re-evaluated prior to induction Oxygen Delivery Method: Circle system utilized Preoxygenation: Pre-oxygenation with 100% oxygen Induction Type: IV induction LMA: LMA inserted LMA Size: 3.0 Number of attempts: 1 Placement Confirmation: positive ETCO2 and breath sounds checked- equal and bilateral Tube secured with: Tape Dental Injury: Teeth and Oropharynx as per pre-operative assessment

## 2024-10-30 NOTE — Anesthesia Postprocedure Evaluation (Signed)
 Anesthesia Post Note  Patient: Sharon Stevenson  Procedure(s) Performed: CYSTOURETEROSCOPY, USING HOLMIUM LASER (Left: Ureter)  Patient location during evaluation: Endoscopy Anesthesia Type: General Level of consciousness: awake and alert Pain management: pain level controlled Vital Signs Assessment: post-procedure vital signs reviewed and stable Respiratory status: spontaneous breathing, nonlabored ventilation and respiratory function stable Cardiovascular status: blood pressure returned to baseline and stable Postop Assessment: no apparent nausea or vomiting Anesthetic complications: no   There were no known notable events for this encounter.   Last Vitals:  Vitals:   10/30/24 1013 10/30/24 1026  BP: 108/63 101/71  Pulse: (!) 56 70  Resp:  14  Temp: (!) 36.2 C (!) 36.4 C  SpO2: 100% 100%    Last Pain:  Vitals:   10/30/24 1026  TempSrc: Temporal  PainSc: 0-No pain                 Camellia Merilee Louder

## 2024-10-30 NOTE — Op Note (Signed)
 Preoperative diagnosis:  Left ureteral stone   Postoperative diagnosis:  Left ureteral stone   Procedure:  Cystoscopy Left ureteroscopy with laser lithotripsy Left ureteral stent removal  Surgeon: Penne Skye, MD  Anesthesia: General  Complications: None  Intraoperative findings: 6-47mm left distal ureteral stone. Fully treated with laser lithotripsy and basket extracted. Went very well, minimal ureteral trauma - no stent replaced  EBL: Minimal  Specimens: 1. Left ureteral stone  Indication: LAKENZIE MCCLAFFERTY is a 63 y.o. patient with: 7mm left mid/distal ureteral stone   - s/p prior Left ureteral stent placement 09/25/24 for obstructive sepsis   - Ucx (10/18/24) - highly resistant E.coli + Klebsiella - currently on PO Macrobid   After reviewing the management options for treatment, he elected to proceed with the above surgical procedure(s). We have discussed the potential benefits and risks of the procedure, side effects of the proposed treatment, the likelihood of the patient achieving the goals of the procedure, and any potential problems that might occur during the procedure or recuperation. Informed consent has been obtained.  Description of procedure:  The patient was taken to the operating room and general anesthesia was induced.  The patient was placed in the dorsal lithotomy position, prepped and draped in the usual sterile fashion, and preoperative antibiotics were administered. A preoperative time-out was performed.   Cystourethroscopy was performed.  The patient's urethra was examined and was normal. The bladder was then systematically examined in its entirety. There was no evidence for any bladder tumors, stones, or other mucosal pathology.  Bilateral ureteral orifices noted in orthotopic location.  Attention then turned to the Left ureteral orifice where the preexisting stent was visualized emanating from the ureter. The stent was controlled with flexible graspers and  externalized, followed by introduction of a sensor wire through the stent into the left renal pelvis under fluoroscopic guidance.  The stent was then fully removed.   We transitioned to a semi-rigid ureteroscope and a complete pyeloscopy was performed.  Stone burden was encountered at the distal ureter consistent with preoperative imaging.  A 200 m holmium laser was used to fully treat the stone with laser lithotripsy.   Next, a zero-tip wire basket was introduced and all sizeable stone fragments were extracted. We performed ureteroscopy to the proximal ureter and no remaining stone fragments were encountered.   The bladder was then emptied and the procedure ended.  The patient appeared to tolerate the procedure well and without complications.  The patient was able to be awakened and transferred to the recovery unit in satisfactory condition.   Plan: - follow up in clinic in 4-6 weeks with PA, RBUS prior to visit  Penne Skye, M.D.

## 2024-10-31 ENCOUNTER — Encounter: Payer: Self-pay | Admitting: Urology

## 2024-11-13 LAB — STONE ANALYSIS
Calcium Phosphate (Carbonate): 30 %
Struvite (MgNH4PO4 6H2O): 70 %
Weight Calculi: 22 mg

## 2024-11-21 ENCOUNTER — Ambulatory Visit: Attending: Urology

## 2024-12-11 NOTE — Progress Notes (Unsigned)
 "    12/12/2024 2:43 PM   Sharon Stevenson 1961/07/05 [id number]  Referring provider: Alston Silvio [name], FNP [address] [place],  [place] [address]  Urological history: 1. Nephrolithiasis - 7 mm left ureteral stone, s/p left stent placement (09/2024)   No chief complaint on file.  HPI: Sharon Stevenson is a 64 y.o. woman who presents today for renal ultrasound results and follow up.    Previous records reviewed.   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    Depression    Dyslipidemia    Former smoker    GERD (gastroesophageal reflux disease)    Hydronephrosis of left kidney    Left ureteral stone    Long term current use of clopidogrel     Menorrhagia with regular cycle 2014   Ablation endometrial, 01/02/2013   PVD (peripheral vascular disease)    Seizures (HCC)    seizures happened with her stroke, none since   Sepsis due to gram-negative UTI (HCC)    Stroke (HCC) 2008   cognitive deficits    Surgical History: Past Surgical History:  Procedure Laterality Date   CESAREAN SECTION     COLONOSCOPY WITH PROPOFOL  N/A 09/21/2023   Procedure: COLONOSCOPY WITH PROPOFOL ;  Surgeon: Therisa Bi, MD;  Location[facility] ENDOSCOPY;  Service: Gastroenterology;  Laterality: N/A;   CYSTOSCOPY/RETROGRADE/URETEROSCOPY/STONE EXTRACTION WITH BASKET Left 09/24/2024   Procedure: CYSTOSCOPY, RIGID, WITH STENT REPLACEMENT;  Surgeon: Georganne Penne SAUNDERS, MD;  Location: [facility] ORS;  Service: Urology;  Laterality: Left;   CYSTOSCOPY/URETEROSCOPY/HOLMIUM LASER Left 10/30/2024   Procedure: CYSTOURETEROSCOPY, USING HOLMIUM LASER;  Surgeon: Georganne Penne SAUNDERS, MD;  Location: [facility] ORS;  Service: Urology;  Laterality: Left;   DILITATION & CURRETTAGE/HYSTROSCOPY WITH THERMACHOICE ABLATION  01/02/2013   Procedure: DILATATION & CURETTAGE/HYSTEROSCOPY WITH THERMACHOICE  ABLATION;  Surgeon: Norleen LULLA Server, MD;  Location: [facility] ORS;  Service: Gynecology;  Laterality: N/A;  Total Ablation Time = 12 minutes 36 seconds     ; 86-88 deg C   HEMOSTASIS CLIP PLACEMENT  09/21/2023   Procedure: HEMOSTASIS CLIP PLACEMENT;  Surgeon: Therisa Bi, MD;  Location: [facility] ENDOSCOPY;  Service: Gastroenterology;;   POLYPECTOMY  09/21/2023   Procedure: POLYPECTOMY;  Surgeon: Therisa Bi, MD;  Location: [facility] ENDOSCOPY;  Service: Gastroenterology;;   TUBAL LIGATION      Home Medications:  Allergies as of 12/12/2024       Reactions   Codeine Nausea And Vomiting        Medication List        Accurate as of December 11, 2024  2:43 PM. If you have any questions, ask your nurse or doctor.          cefpodoxime  100 MG tablet Commonly known as: VANTIN  Take 1 tablet (100 mg total) by mouth daily.   clopidogrel  75 MG tablet Commonly known as: PLAVIX  Take 75 mg by mouth daily.   DULoxetine  60 MG capsule Commonly known as: CYMBALTA  Take 60 mg by mouth every morning.   hydrOXYzine  50 MG capsule Commonly known as: Vistaril  Take 1 capsule (50 mg total) by mouth 3 (three) times daily as needed. What changed: when to take this   levETIRAcetam  500 MG tablet Commonly known as: KEPPRA  Take 500 mg by mouth 2 (two) times daily.   linaclotide  290 MCG Caps capsule Commonly known as:  Linzess  Take 1 capsule (290 mcg total) by mouth daily before breakfast. What changed:  when to take this reasons to take this   LORazepam  0.5 MG tablet Commonly known as: ATIVAN  Take 0.5 mg by mouth 2 (two) times daily.   nitrofurantoin  (macrocrystal-monohydrate) 100 MG capsule Commonly known as: MACROBID  Take 1 capsule (100 mg total) by mouth every 12 (twelve) hours.   Nuedexta  20-10 MG capsule Generic drug: Dextromethorphan -quiNIDine Take 1 capsule by mouth every morning.   pantoprazole  40 MG tablet Commonly known as: PROTONIX  Take 40 mg by mouth every morning.   phenytoin  50 MG  tablet Commonly known as: DILANTIN  Chew 150 mg by mouth 2 (two) times daily.   QUEtiapine  400 MG tablet Commonly known as: SEROQUEL  Take 400 mg by mouth at bedtime.   simvastatin  20 MG tablet Commonly known as: ZOCOR  Take 20 mg by mouth every morning.   STOOL SOFTENER PO Take 1 tablet by mouth as needed.        Allergies:  Allergies  Allergen Reactions   Codeine Nausea And Vomiting    Family History: No family history on file.  Social History:  reports that she has quit smoking. She has quit using smokeless tobacco.  Her smokeless tobacco use included chew. She reports that she does not drink alcohol and does not use drugs.  ROS: Pertinent ROS in HPI  Physical Exam: There were no vitals taken for this visit.  Constitutional:  Well nourished. Alert and oriented, No acute distress. HEENT: [place] AT, moist mucus membranes.  Trachea midline, no masses. Cardiovascular: No clubbing, cyanosis, or edema. Respiratory: Normal respiratory effort, no increased work of breathing. GU: No CVA tenderness.  No bladder fullness or masses.  Recession of labia minora, dry, pale vulvar vaginal mucosa and loss of mucosal ridges and folds.  Normal urethral meatus, no lesions, no prolapse, no discharge.   No urethral masses, tenderness and/or tenderness. No bladder fullness, tenderness or masses. *** vagina mucosa, *** estrogen effect, no discharge, no lesions, *** pelvic support, *** cystocele and *** rectocele noted.  No cervical motion tenderness.  Uterus is freely mobile and non-fixed.  No adnexal/parametria masses or tenderness noted.  Anus and perineum are without rashes or lesions.   ***  Neurologic: Grossly intact, no focal deficits, moving all 4 extremities. Psychiatric: Normal mood and affect.    Laboratory Data: See Epic and HPI   I have reviewed the labs.   Pertinent Imaging: ***   Assessment & Plan:    1. Left ureteral stone - ***  No follow-ups on file.  These notes  generated with voice recognition software. I apologize for typographical errors.  Sharon Stevenson  [facility] [address]  [address] [place], [place] [address] [phone]  "

## 2024-12-12 ENCOUNTER — Encounter: Payer: Self-pay | Admitting: Urology

## 2024-12-12 ENCOUNTER — Ambulatory Visit (INDEPENDENT_AMBULATORY_CARE_PROVIDER_SITE_OTHER): Admitting: Urology

## 2024-12-12 VITALS — BP 112/85 | HR 90

## 2024-12-12 DIAGNOSIS — N201 Calculus of ureter: Secondary | ICD-10-CM

## 2024-12-12 NOTE — Patient Instructions (Signed)
 Please call scheduling at [phone] for schedule renal ultrasound
